# Patient Record
Sex: Male | Born: 1935 | Race: White | Hispanic: No | Marital: Married | Smoking: Former smoker
Health system: Southern US, Community
[De-identification: ages and names within clinical notes are randomized; demographics above are authoritative.]

## PROBLEM LIST (undated history)

## (undated) DIAGNOSIS — K219 Gastro-esophageal reflux disease without esophagitis: Secondary | ICD-10-CM

## (undated) DIAGNOSIS — M1990 Unspecified osteoarthritis, unspecified site: Secondary | ICD-10-CM

## (undated) DIAGNOSIS — N400 Enlarged prostate without lower urinary tract symptoms: Secondary | ICD-10-CM

## (undated) DIAGNOSIS — Z85820 Personal history of malignant melanoma of skin: Secondary | ICD-10-CM

## (undated) DIAGNOSIS — I10 Essential (primary) hypertension: Secondary | ICD-10-CM

## (undated) DIAGNOSIS — Z8546 Personal history of malignant neoplasm of prostate: Secondary | ICD-10-CM

## (undated) DIAGNOSIS — F419 Anxiety disorder, unspecified: Secondary | ICD-10-CM

## (undated) DIAGNOSIS — Z9889 Other specified postprocedural states: Secondary | ICD-10-CM

## (undated) HISTORY — DX: Anxiety disorder, unspecified: F41.9

## (undated) HISTORY — PX: INGUINAL HERNIA REPAIR: SUR1180

## (undated) HISTORY — DX: Gastro-esophageal reflux disease without esophagitis: K21.9

## (undated) HISTORY — DX: Benign prostatic hyperplasia without lower urinary tract symptoms: N40.0

---

## 1986-07-08 HISTORY — PX: OTHER SURGICAL HISTORY: SHX169

## 1998-04-03 ENCOUNTER — Encounter: Admission: RE | Admit: 1998-04-03 | Discharge: 1998-04-03 | Payer: Self-pay | Admitting: Internal Medicine

## 1998-04-18 ENCOUNTER — Encounter: Admission: RE | Admit: 1998-04-18 | Discharge: 1998-04-18 | Payer: Self-pay | Admitting: Hematology and Oncology

## 1999-01-10 ENCOUNTER — Encounter: Admission: RE | Admit: 1999-01-10 | Discharge: 1999-01-10 | Payer: Self-pay | Admitting: Internal Medicine

## 1999-05-09 ENCOUNTER — Encounter: Admission: RE | Admit: 1999-05-09 | Discharge: 1999-05-09 | Payer: Self-pay | Admitting: Internal Medicine

## 1999-06-13 ENCOUNTER — Encounter: Admission: RE | Admit: 1999-06-13 | Discharge: 1999-06-13 | Payer: Self-pay | Admitting: Internal Medicine

## 1999-08-22 ENCOUNTER — Encounter: Admission: RE | Admit: 1999-08-22 | Discharge: 1999-08-22 | Payer: Self-pay | Admitting: Internal Medicine

## 2000-01-14 ENCOUNTER — Encounter: Admission: RE | Admit: 2000-01-14 | Discharge: 2000-01-14 | Payer: Self-pay | Admitting: Internal Medicine

## 2000-01-18 ENCOUNTER — Encounter: Admission: RE | Admit: 2000-01-18 | Discharge: 2000-01-18 | Payer: Self-pay | Admitting: Internal Medicine

## 2000-03-26 ENCOUNTER — Encounter: Admission: RE | Admit: 2000-03-26 | Discharge: 2000-03-26 | Payer: Self-pay | Admitting: Internal Medicine

## 2001-05-12 ENCOUNTER — Encounter: Admission: RE | Admit: 2001-05-12 | Discharge: 2001-05-12 | Payer: Self-pay | Admitting: Internal Medicine

## 2002-12-17 ENCOUNTER — Ambulatory Visit (HOSPITAL_COMMUNITY): Admission: RE | Admit: 2002-12-17 | Discharge: 2002-12-17 | Payer: Self-pay | Admitting: Gastroenterology

## 2003-05-18 ENCOUNTER — Encounter: Admission: RE | Admit: 2003-05-18 | Discharge: 2003-05-18 | Payer: Self-pay | Admitting: Family Medicine

## 2006-05-21 ENCOUNTER — Encounter: Admission: RE | Admit: 2006-05-21 | Discharge: 2006-05-21 | Payer: Self-pay | Admitting: Family Medicine

## 2007-07-09 HISTORY — PX: PROSTATE SURGERY: SHX751

## 2008-01-08 ENCOUNTER — Encounter: Payer: Self-pay | Admitting: Infectious Diseases

## 2008-02-19 ENCOUNTER — Ambulatory Visit: Admission: RE | Admit: 2008-02-19 | Discharge: 2008-05-12 | Payer: Self-pay | Admitting: Radiation Oncology

## 2008-03-24 ENCOUNTER — Ambulatory Visit: Payer: Self-pay | Admitting: Infectious Diseases

## 2008-03-24 DIAGNOSIS — A4902 Methicillin resistant Staphylococcus aureus infection, unspecified site: Secondary | ICD-10-CM | POA: Insufficient documentation

## 2008-03-25 ENCOUNTER — Encounter: Payer: Self-pay | Admitting: Infectious Diseases

## 2008-05-12 ENCOUNTER — Ambulatory Visit: Admission: RE | Admit: 2008-05-12 | Discharge: 2008-06-13 | Payer: Self-pay | Admitting: Radiation Oncology

## 2008-06-13 ENCOUNTER — Encounter: Admission: RE | Admit: 2008-06-13 | Discharge: 2008-06-13 | Payer: Self-pay | Admitting: Urology

## 2008-06-15 ENCOUNTER — Ambulatory Visit (HOSPITAL_BASED_OUTPATIENT_CLINIC_OR_DEPARTMENT_OTHER): Admission: RE | Admit: 2008-06-15 | Discharge: 2008-06-15 | Payer: Self-pay | Admitting: Urology

## 2008-07-14 ENCOUNTER — Ambulatory Visit: Admission: RE | Admit: 2008-07-14 | Discharge: 2008-08-08 | Payer: Self-pay | Admitting: Radiation Oncology

## 2010-07-25 LAB — DIFFERENTIAL
Basophils Absolute: 0 10*3/uL (ref 0.0–0.1)
Basophils Relative: 0 % (ref 0–1)
Eosinophils Absolute: 0.3 10*3/uL (ref 0.0–0.7)
Eosinophils Relative: 3 % (ref 0–5)
Lymphocytes Relative: 34 % (ref 12–46)
Lymphs Abs: 4.1 10*3/uL — ABNORMAL HIGH (ref 0.7–4.0)
Monocytes Absolute: 0.9 10*3/uL (ref 0.1–1.0)
Monocytes Relative: 7 % (ref 3–12)
Neutro Abs: 6.8 10*3/uL (ref 1.7–7.7)
Neutrophils Relative %: 56 % (ref 43–77)

## 2010-07-25 LAB — BASIC METABOLIC PANEL
BUN: 13 mg/dL (ref 6–23)
CO2: 27 mEq/L (ref 19–32)
Calcium: 10.3 mg/dL (ref 8.4–10.5)
Chloride: 102 mEq/L (ref 96–112)
Creatinine, Ser: 0.97 mg/dL (ref 0.4–1.5)
GFR calc Af Amer: >60 mL/min (ref >60)
GFR calc non Af Amer: >60 mL/min (ref >60)
Glucose, Bld: 107 mg/dL — ABNORMAL HIGH (ref 70–99)
Potassium: 4.8 mEq/L (ref 3.5–5.1)
Sodium: 140 mEq/L (ref 135–145)

## 2010-07-25 LAB — CBC
HCT: 47.3 % (ref 39.0–52.0)
Hemoglobin: 16.1 g/dL (ref 13.0–17.0)
MCH: 29.5 pg (ref 26.0–34.0)
MCHC: 34 g/dL (ref 30.0–36.0)
MCV: 86.6 fL (ref 78.0–100.0)
Platelets: 309 10*3/uL (ref 150–400)
RBC: 5.46 MIL/uL (ref 4.22–5.81)
RDW: 13.2 % (ref 11.5–15.5)
WBC: 12.1 10*3/uL — ABNORMAL HIGH (ref 4.0–10.5)

## 2010-07-25 LAB — SURGICAL PCR SCREEN
MRSA, PCR: POSITIVE — AB
Staphylococcus aureus: POSITIVE — AB

## 2010-07-26 ENCOUNTER — Ambulatory Visit (HOSPITAL_COMMUNITY)
Admission: RE | Admit: 2010-07-26 | Discharge: 2010-07-26 | Payer: Self-pay | Source: Home / Self Care | Attending: General Surgery | Admitting: General Surgery

## 2010-07-27 NOTE — Op Note (Signed)
NAMESTEPHANIE, MCGLONE                ACCOUNT NO.:  [id number]  MEDICAL RECORD NO.:  [id number]          PATIENT TYPE:  AMB  LOCATION:  SDS                          FACILITY:  [facility]  PHYSICIAN:  Lennie Muckle, MD      DATE OF BIRTH:  05-15-1931  DATE OF PROCEDURE:  07/26/2010 DATE OF DISCHARGE:  07/26/2010                              OPERATIVE REPORT   PREOPERATIVE DIAGNOSIS:  Bilateral inguinal hernias.  POSTOPERATIVE DIAGNOSIS:  Bilateral inguinal hernias.  PROCEDURE:  Bilateral laparoscopic inguinal hernia repair with mesh.  COMPLICATIONS:  No immediate complications.  ESTIMATED BLOOD LOSS:  Minimal amount of blood loss.  ANESTHESIA:  General endotracheal anesthesia.  INDICATIONS FOR PROCEDURE:  Mr. Kabir is a 77 year old male who was found to have bilateral inguinal hernias.  He had had some pelvic pain and some difficulty with urination and seen by Dr. Vernie Ammons.  He began to have pain over his left groin.  The pain was there with lying down.  On examination, he did have bilateral inguinal hernias.  I talked with him about doing laparoscopic repair that way and have both repairs but had minimal amount of swelling discomfort.  He was seen preoperatively. Informed consent was obtained.  DETAILS OF PROCEDURE:  Mr. Hipolito Bayley was identified in the preoperative holding area.  All questions were answered.  We did discuss leaving the Foley catheter due to his difficulty with urination in the past.  He was seen by Anesthesia given IV antibiotics and taken to the operating room. At the operating room, he was placed in supine position.  After administration of general endotracheal anesthesia, a Foley catheter was placed without difficulty.  His abdomen was then clipped, prepped and draped in usual sterile fashion.  Surgical time-out performed.  I began by placing an incision beneath the umbilicus and due to identify the anterior rectus fascia.  The anterior rectus fascia was incised.   The finger dissected in the preperitoneal space.  I then placed a Spacemaker plus balloon in the preperitoneal space and insufflated this well and visualizing this with the camera.  I insufflated 30 times, hold the balloon cranially and reinsufflated under visualization with camera.  I then removed the Spacemaker plus balloon, insufflated the laparoscopic port and obtaining pneumoinsufflation in the preperitoneal space.  I then placed 2 trocars in the midline under visualization with the camera.  I began by dissecting on the left side of the abdomen, pushing the peritoneum inferiorly.  I dissected down towards the internal ring, cleared off the hernia sac, which was closed in the internal ring. There was a component of a direct hernia, which I was able to clear off the adipose tissue in this vicinity.  He had some large lymph nodes around the internal iliac vessels.  I was able to clear up enough space to place a piece of 3 x 6 UltraPro mesh and tacked this at Cooper's ligament around the direct hernia site just medial to the epigastric vessels as well as laterally while palpating the protract device.  I then began by dissecting on the right side of the abdomen, pushed the peritoneum  inferiorly along the abdominal wall came down towards the internal ring, __________ peritoneum around the internal ring.  I did isolate his spermatic cord vessels.  I also found a direct hernia on this side cleared out the direct hernia site.  I then placed a second piece of UltraPro mesh within the preperitoneal space, fixed this at the Cooper's ligament around the direct hernia sac and laterally while palpating the protract device, I made a small rent within the peritoneum and placed the Endoloop around this on the left.  I then held the inferior edges of the mesh while allowing the pneumo insufflation to release.  I then removed the trocars after releasing the pneumoperitoneum, closed the fascial defect  at the umbilical region using a 0 Vicryl suture.  Once this was closed, I injected 30 mL of Marcaine at all sites for local block.  Skin was closed with 4-0 Monocryl.  Dermabond placed upon the dressing.  The patient tolerated the procedure will be discharged home with Vicodin.  He also be discharged with a leg bag.  He will follow up.  I will have him discontinue him home on Monday morning.  If he has any problems with voiding, I will have him likely follow up with Dr. Vernie Ammons.     Lennie Muckle, MD     ALA/MEDQ  D:  07/26/2010  T:  07/27/2010  Job:  [id number]  cc:   Veverly Fells. Vernie Ammons, M.D. John Dr. Clelia Croft  Electronically Signed by Bertram Savin MD on 07/27/2010 02:51:52 PM

## 2010-11-20 NOTE — Op Note (Signed)
Glen Henry, Glen Henry                ACCOUNT NO.:  [id number]   MEDICAL RECORD NO.:  [id number]          PATIENT TYPE:  AMB   LOCATION:  [facility]                         FACILITY:  [facility]   PHYSICIAN:  Mark C. Vernie Ammons, M.D.  DATE OF BIRTH:  12-21-1930   DATE OF PROCEDURE:  06/15/2008  DATE OF DISCHARGE:                               OPERATIVE REPORT   PREOPERATIVE DIAGNOSES:  Adenocarcinoma of the prostate.   POSTOPERATIVE DIAGNOSES:  Adenocarcinoma of the prostate.   PROCEDURE:  I-125 seed implantation.   SURGEON:  Mark C. Vernie Ammons, M.D.   RADIATION ONCOLOGIST:  Artist Pais. Kathrynn Running, M.D.   ANESTHESIA:  General.   SPECIMENS:  None.   BLOOD LOSS:  Minimal.   DRAINS:  16-French Foley catheter.   COMPLICATIONS:  None.   INDICATIONS:  Patient is a 77 year old male who was found to have an  elevated PSA of 7.8 and a subtle apical nodule on the left-hand side.  He underwent biopsy of the prostate in December 2009 which revealed an  83-cc prostate and adenocarcinoma in multiple cores ranging from 10% to  60% involvement of each of the cores.  He underwent neoadjuvant Lupron  therapy because of the size of his prostate and then had external beam  radiation therapy to the pelvis which is being followed today with I-125  seed implantation.  The patient understands the risks and complications  of surgery and has elected to proceed.   DESCRIPTION OF OPERATION:  After informed consent, patient brought to  the major OR, placed on table, administered general anesthesia, then  moved to the dorsal lithotomy position.  A 16-French Foley catheter was  placed in the bladder with dilute contrast used to fill the balloon and  the transrectal ultrasound probe was placed within the rectum.  It was  connected to the Nucletron and stand and then real-time planning was  then performed.  After the full seed plan was completed by Dr. Kathrynn Running,  I proceeded with seed implantation.   Using the previously  developed plan, a total of 24 needles were used to  implant 79 seeds within the prostate without difficulty.  The  transrectal ultrasound probe was then removed from the rectum and the  Foley catheter was removed from the urethra and flexible cystoscopy was  performed.   Flexible cystoscopy reveals urethra that is normal down to the sphincter  which appears intact.  The prostatic urethra revealed bilobar  hypertrophy with some elongation of the prostatic urethra but no  intraprostatic lesions were identified.  No seeds or foreign bodies were  identified within the prostatic urethra.  Upon entering the bladder, I  note 3+ trabeculation with early cellule formation on the floor the  bladder.  Note, the bladder was then fully and systematically inspected  including retroflexion of the scope and I noted no tumor, stones or  inflammatory lesions.  I also noted no evidence of seeds on the floor of  the bladder or protruding through the base of the prostate into the  bladder.  A moderate-sized median lobe component was identified.  I  therefore  removed the cystoscope and reinserted a new 16-French Foley  catheter which was connected to closed-system drainage and the patient  was awakened and taken to the recovery room in stable, satisfactory  condition.  He tolerated procedure well with no intraoperative  complications.   He will be given a prescription for 30 Vicodin ES, 30 Flomax 0.4 mg and  10 Cipro 500 mg.  He will follow up with myself and Dr. Kathrynn Running in 3  weeks.      Mark C. Vernie Ammons, M.D.  Electronically Signed     MCO/MEDQ  D:  06/15/2008  T:  06/15/2008  Job:  [id number]   cc:   Artist Pais Kathrynn Running, M.D.  Fax: [phone]

## 2011-04-12 LAB — COMPREHENSIVE METABOLIC PANEL
ALT: 54 U/L — ABNORMAL HIGH (ref 0–53)
AST: 35 U/L (ref 0–37)
Albumin: 3.9 g/dL (ref 3.5–5.2)
Alkaline Phosphatase: 64 U/L (ref 39–117)
BUN: 10 mg/dL (ref 6–23)
CO2: 34 mEq/L — ABNORMAL HIGH (ref 19–32)
Calcium: 10.1 mg/dL (ref 8.4–10.5)
Chloride: 102 mEq/L (ref 96–112)
Creatinine, Ser: 0.89 mg/dL (ref 0.4–1.5)
GFR calc Af Amer: >60 mL/min (ref >60)
GFR calc non Af Amer: >60 mL/min (ref >60)
Glucose, Bld: 97 mg/dL (ref 70–99)
Potassium: 4 mEq/L (ref 3.5–5.1)
Sodium: 139 mEq/L (ref 135–145)
Total Bilirubin: 1 mg/dL (ref 0.3–1.2)
Total Protein: 6.7 g/dL (ref 6.0–8.3)

## 2011-04-12 LAB — APTT: aPTT: 36 seconds (ref 24–37)

## 2011-04-12 LAB — CBC
HCT: 43.5 % (ref 39.0–52.0)
Hemoglobin: 14.7 g/dL (ref 13.0–17.0)
MCHC: 33.7 g/dL (ref 30.0–36.0)
MCV: 90.1 fL (ref 78.0–100.0)
Platelets: 305 10*3/uL (ref 150–400)
RBC: 4.83 MIL/uL (ref 4.22–5.81)
RDW: 13.5 % (ref 11.5–15.5)
WBC: 6.7 10*3/uL (ref 4.0–10.5)

## 2011-04-12 LAB — PROTIME-INR
INR: 0.9 (ref 0.00–1.49)
Prothrombin Time: 12.7 seconds (ref 11.6–15.2)

## 2011-10-06 ENCOUNTER — Inpatient Hospital Stay (HOSPITAL_COMMUNITY)
Admission: EM | Admit: 2011-10-06 | Discharge: 2011-10-10 | DRG: 193 | Disposition: A | Discharge status: Home / Self Care | Payer: Medicare Other | Attending: Internal Medicine | Admitting: Internal Medicine

## 2011-10-06 ENCOUNTER — Ambulatory Visit: Payer: Medicare Other

## 2011-10-06 ENCOUNTER — Ambulatory Visit (INDEPENDENT_AMBULATORY_CARE_PROVIDER_SITE_OTHER): Payer: Medicare Other | Admitting: Family Medicine

## 2011-10-06 ENCOUNTER — Emergency Department (HOSPITAL_COMMUNITY): Payer: Medicare Other

## 2011-10-06 ENCOUNTER — Encounter (HOSPITAL_COMMUNITY): Payer: Self-pay | Admitting: Family Medicine

## 2011-10-06 VITALS — BP 162/78 | HR 101 | Temp 100.0°F | Resp 18 | Ht 66.0 in | Wt 172.0 lb

## 2011-10-06 DIAGNOSIS — A0472 Enterocolitis due to Clostridium difficile, not specified as recurrent: Secondary | ICD-10-CM | POA: Diagnosis present

## 2011-10-06 DIAGNOSIS — R05 Cough: Secondary | ICD-10-CM

## 2011-10-06 DIAGNOSIS — A4902 Methicillin resistant Staphylococcus aureus infection, unspecified site: Secondary | ICD-10-CM

## 2011-10-06 DIAGNOSIS — G9340 Encephalopathy, unspecified: Secondary | ICD-10-CM | POA: Diagnosis present

## 2011-10-06 DIAGNOSIS — I10 Essential (primary) hypertension: Secondary | ICD-10-CM | POA: Diagnosis present

## 2011-10-06 DIAGNOSIS — Z87891 Personal history of nicotine dependence: Secondary | ICD-10-CM

## 2011-10-06 DIAGNOSIS — D72829 Elevated white blood cell count, unspecified: Secondary | ICD-10-CM | POA: Diagnosis present

## 2011-10-06 DIAGNOSIS — J189 Pneumonia, unspecified organism: Principal | ICD-10-CM | POA: Diagnosis present

## 2011-10-06 DIAGNOSIS — E860 Dehydration: Secondary | ICD-10-CM

## 2011-10-06 DIAGNOSIS — R509 Fever, unspecified: Secondary | ICD-10-CM

## 2011-10-06 DIAGNOSIS — R4182 Altered mental status, unspecified: Secondary | ICD-10-CM

## 2011-10-06 DIAGNOSIS — Z9889 Other specified postprocedural states: Secondary | ICD-10-CM

## 2011-10-06 DIAGNOSIS — E871 Hypo-osmolality and hyponatremia: Secondary | ICD-10-CM | POA: Diagnosis present

## 2011-10-06 DIAGNOSIS — R197 Diarrhea, unspecified: Secondary | ICD-10-CM

## 2011-10-06 DIAGNOSIS — Z8546 Personal history of malignant neoplasm of prostate: Secondary | ICD-10-CM

## 2011-10-06 DIAGNOSIS — R9389 Abnormal findings on diagnostic imaging of other specified body structures: Secondary | ICD-10-CM | POA: Diagnosis present

## 2011-10-06 DIAGNOSIS — Z85820 Personal history of malignant melanoma of skin: Secondary | ICD-10-CM

## 2011-10-06 DIAGNOSIS — R911 Solitary pulmonary nodule: Secondary | ICD-10-CM | POA: Diagnosis present

## 2011-10-06 HISTORY — DX: Personal history of malignant neoplasm of prostate: Z85.46

## 2011-10-06 HISTORY — DX: Other specified postprocedural states: Z98.890

## 2011-10-06 HISTORY — DX: Personal history of malignant melanoma of skin: Z85.820

## 2011-10-06 HISTORY — DX: Essential (primary) hypertension: I10

## 2011-10-06 LAB — DIFFERENTIAL
Basophils Absolute: 0 10*3/uL (ref 0.0–0.1)
Basophils Relative: 0 % (ref 0–1)
Eosinophils Absolute: 0 10*3/uL (ref 0.0–0.7)
Eosinophils Relative: 0 % (ref 0–5)
Lymphocytes Relative: 14 % (ref 12–46)
Lymphs Abs: 3.6 10*3/uL (ref 0.7–4.0)
Monocytes Absolute: 2.1 10*3/uL — ABNORMAL HIGH (ref 0.1–1.0)
Monocytes Relative: 8 % (ref 3–12)
Neutro Abs: 20 10*3/uL — ABNORMAL HIGH (ref 1.7–7.7)
Neutrophils Relative %: 78 % — ABNORMAL HIGH (ref 43–77)

## 2011-10-06 LAB — BASIC METABOLIC PANEL
BUN: 12 mg/dL (ref 6–23)
CO2: 24 mEq/L (ref 19–32)
Calcium: 9.9 mg/dL (ref 8.4–10.5)
Chloride: 98 mEq/L (ref 96–112)
Creatinine, Ser: 0.84 mg/dL (ref 0.50–1.35)
GFR calc Af Amer: >90 mL/min (ref >90)
GFR calc non Af Amer: 82 mL/min — ABNORMAL LOW (ref >90)
Glucose, Bld: 126 mg/dL — ABNORMAL HIGH (ref 70–99)
Potassium: 4.1 mEq/L (ref 3.5–5.1)
Sodium: 135 mEq/L (ref 135–145)

## 2011-10-06 LAB — CBC
HCT: 42.1 % (ref 39.0–52.0)
Hemoglobin: 14.7 g/dL (ref 13.0–17.0)
MCH: 30.5 pg (ref 26.0–34.0)
MCHC: 34.9 g/dL (ref 30.0–36.0)
MCV: 87.3 fL (ref 78.0–100.0)
Platelets: 511 10*3/uL — ABNORMAL HIGH (ref 150–400)
RBC: 4.82 MIL/uL (ref 4.22–5.81)
RDW: 13.7 % (ref 11.5–15.5)
WBC: 25.7 10*3/uL — ABNORMAL HIGH (ref 4.0–10.5)

## 2011-10-06 LAB — POCT CBC
Granulocyte percent: 81.9 %G — AB (ref 37–80)
HCT, POC: 42 % — AB (ref 43.5–53.7)
Hemoglobin: 13.7 g/dL — AB (ref 14.1–18.1)
Lymph, poc: 2.7 (ref 0.6–3.4)
MCH, POC: 28.8 pg (ref 27–31.2)
MCHC: 32.6 g/dL (ref 31.8–35.4)
MCV: 88.3 fL (ref 80–97)
MID (cbc): 1.2 — AB (ref 0–0.9)
MPV: 7.9 fL (ref 0–99.8)
POC Granulocyte: 17.7 — AB (ref 2–6.9)
POC LYMPH PERCENT: 12.4 %L (ref 10–50)
POC MID %: 5.7 %M (ref 0–12)
Platelet Count, POC: 586 10*3/uL — AB (ref 142–424)
RBC: 4.76 M/uL (ref 4.69–6.13)
RDW, POC: 14.4 %
WBC: 21.6 10*3/uL — AB (ref 4.6–10.2)

## 2011-10-06 LAB — URINALYSIS, ROUTINE W REFLEX MICROSCOPIC
Bilirubin Urine: NEGATIVE
Glucose, UA: NEGATIVE mg/dL
Ketones, ur: NEGATIVE mg/dL
Nitrite: NEGATIVE
Protein, ur: 30 mg/dL — AB
Specific Gravity, Urine: 1.021 (ref 1.005–1.030)
Urobilinogen, UA: 1 mg/dL (ref 0.0–1.0)
pH: 6.5 (ref 5.0–8.0)

## 2011-10-06 LAB — URINE MICROSCOPIC-ADD ON

## 2011-10-06 MED ORDER — ALBUTEROL SULFATE (5 MG/ML) 0.5% IN NEBU
5.0000 mg | INHALATION_SOLUTION | Freq: Once | RESPIRATORY_TRACT | Status: AC
  Administered 2011-10-06: 5 mg via RESPIRATORY_TRACT
  Filled 2011-10-06 (×2): qty 1

## 2011-10-06 MED ORDER — SODIUM CHLORIDE 0.9 % IV SOLN
INTRAVENOUS | Status: DC
  Administered 2011-10-06: 21:00:00 via INTRAVENOUS

## 2011-10-06 MED ORDER — DEXTROSE 5 % IV SOLN
1.0000 g | Freq: Once | INTRAVENOUS | Status: AC
  Administered 2011-10-07: 1 g via INTRAVENOUS
  Filled 2011-10-06: qty 10

## 2011-10-06 MED ORDER — IOHEXOL 300 MG/ML  SOLN
100.0000 mL | Freq: Once | INTRAMUSCULAR | Status: AC | PRN
  Administered 2011-10-06: 100 mL via INTRAVENOUS

## 2011-10-06 MED ORDER — PREDNISONE 20 MG PO TABS
60.0000 mg | ORAL_TABLET | Freq: Once | ORAL | Status: AC
  Administered 2011-10-06: 60 mg via ORAL
  Filled 2011-10-06: qty 3

## 2011-10-06 MED ORDER — DEXTROSE 5 % IV SOLN
500.0000 mg | Freq: Once | INTRAVENOUS | Status: AC
  Administered 2011-10-07: 500 mg via INTRAVENOUS
  Filled 2011-10-06 (×2): qty 500

## 2011-10-06 NOTE — ED Notes (Signed)
ONG:EX52<WU> Expected date:10/06/11<BR> Expected time: 5:29 PM<BR> Means of arrival:Ambulance<BR> Comments:<BR> M31. 78 yo m. From [employer] Poss Pneumonia. 10 mins

## 2011-10-06 NOTE — ED Notes (Signed)
Pt in via ems from urgent care with possible pneumonia and uti per ems pt has had productive cough x 2 weeks pt states pain with cough

## 2011-10-06 NOTE — ED Notes (Signed)
Pt currently rcving neb tx

## 2011-10-06 NOTE — Progress Notes (Signed)
  Subjective:    Patient ID: Glen Henry, male    DOB: 01-Mar-1931, 79 y.o.   MRN: [id number]  HPI Patient presents with complainig of chest congestion that has been present for several weeks. Over the last 1-2 days patient developed low grade fevers Some wheezing, thought per patient this was  improving   Son has been sick at home ? influenza History of "sinus", worse in the spring   Review of Systems  Respiratory: Positive for cough (purulent in color). Negative for shortness of breath and wheezing.        Objective:   Physical Exam  HENT:  Right Ear: Tympanic membrane normal.  Left Ear: Tympanic membrane normal.  Nose: Rhinorrhea present.  Mouth/Throat: Mucous membranes are dry.  Neck: Neck supple.  Cardiovascular: Regular rhythm and normal heart sounds.  Tachycardia present.  Exam reveals no S3 and no S4.   Pulmonary/Chest: Effort normal. He has decreased breath sounds in the left lower field. He has rhonchi.  Neurological: He is alert.       confused    Results for orders placed in visit on 10/06/11  POCT CBC      Component Value Range   WBC 21.6 (*) 4.6 - 10.2 (K/uL)   Lymph, poc 2.7  0.6 - 3.4    POC LYMPH PERCENT 12.4  10 - 50 (%L)   MID (cbc) 1.2 (*) 0 - 0.9    POC MID % 5.7  0 - 12 (%M)   POC Granulocyte 17.7 (*) 2 - 6.9    Granulocyte percent 81.9 (*) 37 - 80 (%G)   RBC 4.76  4.69 - 6.13 (M/uL)   Hemoglobin 13.7 (*) 14.1 - 18.1 (g/dL)   HCT, POC 40.9 (*) 81.1 - 53.7 (%)   MCV 88.3  80 - 97 (fL)   MCH, POC 28.8  27 - 31.2 (pg)   MCHC 32.6  31.8 - 35.4 (g/dL)   RDW, POC 91.4     Platelet Count, POC 586 (*) 142 - 424 (K/uL)   MPV 7.9  0 - 99.8 (fL)   [facility] reading (PRIMARY) by  Dr.Nelia Rogoff LLL infiltrate        Assessment & Plan:   1. Pneumonia  DG Chest 2 View, POCT CBC  2. hypoxemia  DG Chest 2 View, POCT CBC  3. Dehydration    4. Mental status alteration     IVF NS 2 liter O2 Admit for IV antibiotics, rehydration and O2 therapy  Son,  Darryl,  in agreement with transport [facility] aware of transport and transfer of care

## 2011-10-06 NOTE — ED Provider Notes (Signed)
History     CSN: [id number]  Arrival date & time 10/06/11  1733   First MD Initiated Contact with Patient 10/06/11 1938      Chief Complaint  Patient presents with  . Shortness of Breath    (Consider location/radiation/quality/duration/timing/severity/associated sxs/prior treatment) HPI Comments: Sent for concern of pneumonia  Patient is a 78 y.o. male presenting with cough. The history is provided by the patient. No language interpreter was used.  Cough This is a new problem. The current episode started more than 1 week ago (2 weeks ago). The problem occurs constantly. The problem has been gradually worsening. The cough is productive of sputum. The maximum temperature recorded prior to his arrival was 100 to 100.9 F. Associated symptoms include chills, shortness of breath and wheezing. Pertinent negatives include no chest pain, no sweats, no weight loss, no ear congestion, no ear pain, no headaches, no rhinorrhea, no sore throat and no myalgias. He has tried nothing for the symptoms. The treatment provided no relief. Smoker: former.    No past medical history on file.  No past surgical history on file.  No family history on file.  History  Substance Use Topics  . Smoking status: Former Games developer  . Smokeless tobacco: Not on file  . Alcohol Use: Not on file      Review of Systems  Constitutional: Positive for chills and activity change. Negative for weight loss and appetite change.  HENT: Positive for congestion. Negative for ear pain, sore throat, rhinorrhea, neck pain and neck stiffness.   Respiratory: Positive for cough, shortness of breath and wheezing.   Cardiovascular: Negative for chest pain and palpitations.  Gastrointestinal: Negative for nausea, vomiting and abdominal pain.  Genitourinary: Negative for dysuria, urgency, frequency and flank pain.  Musculoskeletal: Negative for myalgias, back pain and arthralgias.  Neurological: Negative for dizziness, weakness,  light-headedness, numbness and headaches.  All other systems reviewed and are negative.    Allergies  Review of patient's allergies indicates no known allergies.  Home Medications   Current Outpatient Rx  Name Route Sig Dispense Refill  . ASPIRIN 325 MG PO TABS Oral Take 325 mg by mouth 2 (two) times daily.    Marland Kitchen CALCIUM CARBONATE-VITAMIN D 500-200 MG-UNIT PO TABS Oral Take 1 tablet by mouth 2 (two) times daily.      BP 144/71  Pulse 104  Temp(Src) 100 F (37.8 C) (Oral)  Resp 19  SpO2 94%  Physical Exam  Nursing note and vitals reviewed. Constitutional: He is oriented to person, place, and time. He appears well-developed and well-nourished. No distress.  HENT:  Head: Normocephalic and atraumatic.  Mouth/Throat: Oropharynx is clear and moist.  Eyes: Conjunctivae and EOM are normal. Pupils are equal, round, and reactive to light.  Neck: Normal range of motion. Neck supple.  Cardiovascular: Regular rhythm, normal heart sounds and intact distal pulses.  Exam reveals no gallop and no friction rub.   No murmur heard.      Tachycardic rate  Pulmonary/Chest: Effort normal. No respiratory distress. He has wheezes. He exhibits no tenderness.  Abdominal: Soft. Bowel sounds are normal. There is no tenderness.  Musculoskeletal: Normal range of motion. He exhibits no edema and no tenderness.  Neurological: He is alert and oriented to person, place, and time. No cranial nerve deficit.  Skin: Skin is warm and dry. No rash noted.    ED Course  Procedures (including critical care time)  Labs Reviewed  URINALYSIS, ROUTINE W REFLEX MICROSCOPIC - Abnormal; Notable for  the following:    APPearance CLOUDY (*)    Hgb urine dipstick TRACE (*)    Protein, ur 30 (*)    Leukocytes, UA TRACE (*)    All other components within normal limits  CBC - Abnormal; Notable for the following:    WBC 25.7 (*)    Platelets 511 (*)    All other components within normal limits  DIFFERENTIAL - Abnormal;  Notable for the following:    Neutrophils Relative 78 (*)    Neutro Abs 20.0 (*)    Monocytes Absolute 2.1 (*)    All other components within normal limits  BASIC METABOLIC PANEL - Abnormal; Notable for the following:    Glucose, Bld 126 (*)    GFR calc non Af Amer 82 (*)    All other components within normal limits  URINE MICROSCOPIC-ADD ON - Abnormal; Notable for the following:    Bacteria, UA FEW (*)    All other components within normal limits   Dg Chest 2 View  10/06/2011  *RADIOLOGY REPORT*  Clinical Data: Cough.  CHEST - 2 VIEW  Comparison: 07/24/2010.  Findings: The cardiac silhouette, mediastinal and hilar contours are within normal limits and stable.  Bilateral pulmonary nodules are suspected.  Recommend further evaluation with chest CT.  No focal infiltrates, edema or effusions.  The bony thorax is intact.  IMPRESSION: Suspect bilateral pulmonary nodules worrisome for metastatic disease.  Recommend chest CT with contrast for further evaluation.  Original Report Authenticated By: P. Loralie Champagne, M.D.   Ct Chest W Contrast  10/06/2011  *RADIOLOGY REPORT*  Clinical Data: Chest congestion, fever, and wheezing.  Suspicion of pulmonary nodules on plain radiograph. Former smoker.  CT CHEST WITH CONTRAST  Technique:  Multidetector CT imaging of the chest was performed following the standard protocol during bolus administration of intravenous contrast.  Contrast: OMNIPAQUE IOHEXOL 300 MG/ML IJ SOLN  Comparison: Chest 10/06/2011  Findings: Multiple old somewhat poorly defined irregularly marginated ground-glass nodules scattered throughout the right lung.  Largest of these measures about 1.3 cm diameter.  Tiny nodules on the left, greatest size measuring 5 mm.  On the right, they are somewhat more confluent areas in the right middle lung which have the tree in bud pattern.  Changes could represent pulmonary metastasis or atypical nodular infectious process such as TB or fungal infection.  Mildly enlarged right pretracheal, right hilar, and subcarinal lymph nodes measuring up to 13 mm diameter. These could represent metastatic or reactive nodes.  Surgical clips in the left axilla.  No additional lymphadenopathy in the chest. Normal caliber thoracic aorta without aneurysm.  Normal heart size. Small esophageal hiatal hernia.  The esophagus is mostly decompressed without wall thickening.  No pleural effusion.  Mild dependent atelectasis in the lung bases.  Degenerative changes in the thoracic spine.  No destructive bone lesions appreciated.  IMPRESSION: Multiple poorly defined nodules scattered throughout the right lung. Mild right-sided mediastinal lymphadenopathy.  Differential diagnosis includes metastasis, lymphoma, multi focal primary carcinoma, or atypical infectious process.  Original Report Authenticated By: Marlon Pel, M.D.     1. Leukocytosis   2. Atypical pneumonia       MDM  Leukocytosis with concern for atypical pneumonia. As atypical pneumonia versus possible mass. He is placed on Rocephin and Zithromax. Received IV fluids. He will be admitted for further evaluation and treatment.        Dayton Bailiff, MD 10/06/11 2259

## 2011-10-07 ENCOUNTER — Encounter (HOSPITAL_COMMUNITY): Payer: Self-pay

## 2011-10-07 DIAGNOSIS — J9600 Acute respiratory failure, unspecified whether with hypoxia or hypercapnia: Secondary | ICD-10-CM

## 2011-10-07 DIAGNOSIS — R911 Solitary pulmonary nodule: Secondary | ICD-10-CM

## 2011-10-07 LAB — EXPECTORATED SPUTUM ASSESSMENT W GRAM STAIN, RFLX TO RESP C

## 2011-10-07 LAB — BASIC METABOLIC PANEL
CO2: 24 mEq/L (ref 19–32)
Chloride: 95 mEq/L — ABNORMAL LOW (ref 96–112)
Sodium: 130 mEq/L — ABNORMAL LOW (ref 135–145)

## 2011-10-07 LAB — CEA: CEA: 0.7 ng/mL (ref 0.0–5.0)

## 2011-10-07 LAB — AFP TUMOR MARKER: AFP-Tumor Marker: <1.3 ng/mL (ref 0.0–8.0)

## 2011-10-07 LAB — MRSA PCR SCREENING: MRSA by PCR: POSITIVE — AB

## 2011-10-07 LAB — INFLUENZA PANEL BY PCR (TYPE A & B): H1N1 flu by pcr: NOT DETECTED

## 2011-10-07 LAB — CBC
Platelets: 532 10*3/uL — ABNORMAL HIGH (ref 150–400)
RBC: 4.69 MIL/uL (ref 4.22–5.81)
WBC: 26.1 10*3/uL — ABNORMAL HIGH (ref 4.0–10.5)

## 2011-10-07 LAB — SEDIMENTATION RATE: Sed Rate: 45 mm/hr — ABNORMAL HIGH (ref 0–16)

## 2011-10-07 MED ORDER — IPRATROPIUM BROMIDE 0.02 % IN SOLN: 0.5000 mg | RESPIRATORY_TRACT | Status: DC | PRN

## 2011-10-07 MED ORDER — SODIUM CHLORIDE 0.9 % IJ SOLN: 3.0000 mL | INTRAMUSCULAR | Status: DC | PRN

## 2011-10-07 MED ORDER — BISACODYL 5 MG PO TBEC
10.0000 mg | DELAYED_RELEASE_TABLET | Freq: Every day | ORAL | Status: DC | PRN
  Administered 2011-10-07: 10 mg via ORAL
  Filled 2011-10-07: qty 2

## 2011-10-07 MED ORDER — ACETAMINOPHEN 325 MG PO TABS: 650.0000 mg | ORAL_TABLET | Freq: Four times a day (QID) | ORAL | Status: DC | PRN

## 2011-10-07 MED ORDER — MUPIROCIN 2 % EX OINT
1.0000 "application " | TOPICAL_OINTMENT | Freq: Two times a day (BID) | CUTANEOUS | Status: DC
  Administered 2011-10-07 – 2011-10-10 (×7): 1 via NASAL
  Filled 2011-10-07: qty 22

## 2011-10-07 MED ORDER — SODIUM CHLORIDE 0.9 % IV SOLN
INTRAVENOUS | Status: AC
  Administered 2011-10-07: 03:00:00 via INTRAVENOUS

## 2011-10-07 MED ORDER — DM-GUAIFENESIN ER 30-600 MG PO TB12
1.0000 | ORAL_TABLET | Freq: Two times a day (BID) | ORAL | Status: DC
  Administered 2011-10-07 – 2011-10-10 (×7): 1 via ORAL
  Filled 2011-10-07 (×8): qty 1

## 2011-10-07 MED ORDER — ALBUTEROL SULFATE (5 MG/ML) 0.5% IN NEBU: 2.5000 mg | INHALATION_SOLUTION | RESPIRATORY_TRACT | Status: DC | PRN

## 2011-10-07 MED ORDER — ONDANSETRON HCL 4 MG/2ML IJ SOLN: 4.0000 mg | Freq: Three times a day (TID) | INTRAMUSCULAR | Status: AC | PRN

## 2011-10-07 MED ORDER — BIOTENE DRY MOUTH MT LIQD
15.0000 mL | Freq: Two times a day (BID) | OROMUCOSAL | Status: DC
  Administered 2011-10-08 – 2011-10-10 (×5): 15 mL via OROMUCOSAL

## 2011-10-07 MED ORDER — CEFEPIME HCL 1 G IJ SOLR
1.0000 g | Freq: Two times a day (BID) | INTRAMUSCULAR | Status: DC
  Administered 2011-10-07 (×2): 1 g via INTRAVENOUS
  Filled 2011-10-07 (×3): qty 1

## 2011-10-07 MED ORDER — ONDANSETRON HCL 4 MG PO TABS: 4.0000 mg | ORAL_TABLET | Freq: Four times a day (QID) | ORAL | Status: DC | PRN

## 2011-10-07 MED ORDER — SODIUM CHLORIDE 0.9 % IV SOLN
INTRAVENOUS | Status: DC
  Administered 2011-10-07 – 2011-10-09 (×4): via INTRAVENOUS

## 2011-10-07 MED ORDER — VANCOMYCIN HCL IN DEXTROSE 1-5 GM/200ML-% IV SOLN
1000.0000 mg | Freq: Two times a day (BID) | INTRAVENOUS | Status: DC
  Administered 2011-10-07 – 2011-10-08 (×4): 1000 mg via INTRAVENOUS
  Filled 2011-10-07 (×5): qty 200

## 2011-10-07 MED ORDER — CHLORHEXIDINE GLUCONATE CLOTH 2 % EX PADS
6.0000 | MEDICATED_PAD | Freq: Every day | CUTANEOUS | Status: DC
  Administered 2011-10-07 – 2011-10-10 (×4): 6 via TOPICAL

## 2011-10-07 MED ORDER — ONDANSETRON HCL 4 MG/2ML IJ SOLN
4.0000 mg | Freq: Four times a day (QID) | INTRAMUSCULAR | Status: DC | PRN
  Administered 2011-10-07: 4 mg via INTRAVENOUS
  Filled 2011-10-07: qty 2

## 2011-10-07 MED ORDER — ENOXAPARIN SODIUM 40 MG/0.4ML ~~LOC~~ SOLN
40.0000 mg | SUBCUTANEOUS | Status: DC
Start: 2011-10-07 — End: 2011-10-10
  Administered 2011-10-07 – 2011-10-10 (×4): 40 mg via SUBCUTANEOUS
  Filled 2011-10-07 (×4): qty 0.4

## 2011-10-07 MED ORDER — HYDROCODONE-ACETAMINOPHEN 5-325 MG PO TABS
1.0000 | ORAL_TABLET | ORAL | Status: DC | PRN
  Administered 2011-10-09: 1 via ORAL
  Filled 2011-10-07: qty 1

## 2011-10-07 MED ORDER — ZOLPIDEM TARTRATE 5 MG PO TABS
5.0000 mg | ORAL_TABLET | Freq: Every evening | ORAL | Status: DC | PRN
  Administered 2011-10-08 – 2011-10-09 (×2): 5 mg via ORAL
  Filled 2011-10-07 (×2): qty 1

## 2011-10-07 MED ORDER — ALUM & MAG HYDROXIDE-SIMETH 200-200-20 MG/5ML PO SUSP
30.0000 mL | Freq: Four times a day (QID) | ORAL | Status: DC | PRN
  Administered 2011-10-07 – 2011-10-08 (×2): 30 mL via ORAL
  Filled 2011-10-07 (×2): qty 30

## 2011-10-07 MED ORDER — ACETAMINOPHEN 650 MG RE SUPP: 650.0000 mg | Freq: Four times a day (QID) | RECTAL | Status: DC | PRN

## 2011-10-07 NOTE — Progress Notes (Signed)
Inpatient Diabetes Program Recommendations  AACE/ADA: New Consensus Statement on Inpatient Glycemic Control (2009)  Target Ranges:  Prepandial:   less than 140 mg/dL      Peak postprandial:   less than 180 mg/dL (1-2 hours)      Critically ill patients:  140 - 180 mg/dL   Reason for Visit:  Results for JABORI, HENEGAR (MRN [id number]) as of 10/07/2011 11:07  Ref. Range 10/07/2011 04:05  Glucose Latest Range: 70-99 mg/dL 409 (H)    Inpatient Diabetes Program Recommendations HgbA1C: Request MD consider ordering Hgb A1C.  Lab glucose this AM was 226 after receiving one-time dose of prednisone 60 mg.    Note: No history of diabetes noted.  Note Bmet ordered for AM.  Thank you. [phone])

## 2011-10-07 NOTE — Progress Notes (Signed)
ANTIBIOTIC CONSULT NOTE - INITIAL  Pharmacy Consult for Vancomycin Indication: rule out pneumonia  Allergies  Allergen Reactions  . Codeine Nausea And Vomiting    Patient Measurements: Height: 5\' 9"  (175.3 cm) Weight: 168 lb 14.4 oz (76.613 kg) IBW/kg (Calculated) : 70.7    Vital Signs: Temp: 97.5 F (36.4 C) (04/01 0145) Temp src: Oral (04/01 0145) BP: 153/85 mmHg (04/01 0145) Pulse Rate: 93  (04/01 0145) Intake/Output from previous day: 03/31 0701 - 04/01 0700 In: -  Out: 300 [Urine:300] Intake/Output from this shift: Total I/O In: -  Out: 300 [Urine:300]  Labs:  [facility] 10/06/11 2055 10/06/11 1622  WBC 25.7* 21.6*  HGB 14.7 13.7*  PLT 511* --  LABCREA -- --  CREATININE 0.84 --   Estimated Creatinine Clearance: 72.5 ml/min (by C-G formula based on Cr of 0.84). No results found for this basename: VANCOTROUGH:2,VANCOPEAK:2,VANCORANDOM:2,GENTTROUGH:2,GENTPEAK:2,GENTRANDOM:2,TOBRATROUGH:2,TOBRAPEAK:2,TOBRARND:2,AMIKACINPEAK:2,AMIKACINTROU:2,AMIKACIN:2, in the last 72 hours   Microbiology: No results found for this or any previous visit (from the past 720 hour(s)).  Medical History: Past Medical History  Diagnosis Date  . H/O prostate cancer     Seed implantation  . Hypertension   . H/O melanoma excision     Excised in 1988    Medications:  Scheduled:    . sodium chloride   Intravenous STAT  . albuterol  5 mg Nebulization Once  . azithromycin  500 mg Intravenous Once  . ceFEPime (MAXIPIME) IV  1 g Intravenous Q12H  . cefTRIAXone (ROCEPHIN)  IV  1 g Intravenous Once  . enoxaparin  40 mg Subcutaneous Q24H  . predniSONE  60 mg Oral Once   Infusions:    . DISCONTD: sodium chloride 125 mL/hr at 10/06/11 2051   Assessment:  78 year old man with chief complaint of shortness of breath.  Reportedly ill for past two weeks.  Rocephin 1gm IV x 1 given in ED ~ 1am today.  Zithromax 500mg  IV x 1 also ordered in ED  CrCl ~ 72 ml/min  Pt with history of  prostate CA and melanoma; chest xray shows findings concerning for malignancy  IV Vancomycin and Cefepime ordered for treatment of suspected pneumonia  Goal of Therapy:  Vancomycin trough level 15-20 mcg/ml  Plan:   Vancomycin 1000mg  IV q12h  Follow cultures & sensitivities  Check vancomycin trough level as appropriate.  Maryellen Pile, PharmD 10/07/2011,2:04 AM

## 2011-10-07 NOTE — Consult Note (Addendum)
Name: Glen Henry MRN: [id number] DOB: 07-28-1930    LOS: 1  [place] [facility]  NOTE  Brief patient profile:  47 yowm with h/o prostate ca and melanoma admitted 3/31 with sev weeks cough with multiple pulmonary nodules on cxr/cT and ccm asked to eval 4/1     Micro/sepsis markers: MRSA screen 4/1 > neg Sputum 4/1 >>>  Antibiotics: Roc/Zmax ER only Maxepime (?pna)4/1>> Vanc (?pna)3/31>>  Tests / Events: CT chest 3/31 > Multiple poorly defined nodules scattered throughout the right  lung. Mild right-sided mediastinal lymphadenopathy  Subective Elderly wm remote smoker with 2 weeks of abupt onset cough, chest congestion with minimal purulent secretions asssoc with fever chills and abn mental status per fm. No sore throat, myalgias, ha, rash n or v and d or unusual traval hx  Sleeping ok without nocturnal  or early am exacerbation  of respiratory  c/o's   Also denies any obvious fluctuation of symptoms with weather or environmental changes or other aggravating or alleviating factors except as outlined above   ROS  At present neg for  any significant sore throat, dysphagia, dental problems, itching, sneezing,   ear ache,  chills, sweats, unintended wt loss, pleuritic or exertional cp, hemoptysis, palpitations, orthopnea pnd or leg swelling.  Also denies presyncope, palpitations, heartburn, abdominal pain, anorexia, nausea, vomiting, diarrhea  or change in bowel or urinary habits, change in stools or urine, dysuria,hematuria,  rash, arthralgias, visual complaints, headache, numbness weakness or ataxia or problems with walking or coordination.                     Vital Signs: BP 137/70  Pulse 77  Temp(Src) 98.3 F (36.8 C) (Oral)  Resp 18  Ht 5\' 9"  (1.753 m)  Wt 168 lb 14.4 oz (76.613 kg)  BMI 24.94 kg/m2  SpO2 92%  On 2lpm  Intake/Output Summary (Last 24 hours) at 10/07/11 1556 Last data filed at 10/07/11 1358  Gross per 24 hour  Intake  717.5 ml  Output    900 ml  Net  -182.5 ml     Physical Examination: General: elderly frail wm nad Neuro:  Nonfocal, responses slowed HEENT:  PERRL, pink conjunctivae, moist membranes Neck:  Supple, no JVD   Cardiovascular:  RRR, no M/R/G Lungs:  Bilateral diminished air entry, no W/R/R Abdomen:  Soft, nontender, nondistended, bowel sounds present Musculoskeletal:  Moves all extremities, no pedal edema Skin:  No rash  Ventilator settings:    Labs    Lab 10/07/11 0405 10/06/11 2055  NA 130* 135  K 3.7 4.1  CL 95* 98  CO2 24 24  BUN 14 12  CREATININE 0.87 0.84  GLUCOSE 226* 126*    Lab 10/07/11 0405 10/06/11 2055 10/06/11 1622  HGB 13.7 14.7 13.7*  HCT 40.7 42.1 42.0*  WBC 26.1* 25.7* 21.6*  PLT 532* 511* --             ASSESSMENT AND PLAN    Multiple pulmonary nodules ? etilogy Met ca, septic emboli, collagen vasc dz in ddx  - No need for further inpt w/u - outpt pet then bx the most accessible hot spot p you treat him for his uri as I don't think these nodules have anything to do with his resp symptoms - Check baseline pct/ esr/ Anca levels      NEUROLOGIC A:  ? Mild tme vs underlying dementia? If not back to baseline consider mri brain to look for head mets as PET scan  not good for cns

## 2011-10-07 NOTE — ED Notes (Signed)
Phleb at bedside drawing 2nd set of [facility]

## 2011-10-07 NOTE — Progress Notes (Signed)
   CARE MANAGEMENT NOTE 10/07/2011  Patient:  Glen Henry, Glen Henry   Account Number:  [id number]  Date Initiated:  10/07/2011  Documentation initiated by:  [facility]  Subjective/Objective Assessment:   ADMITTED W/SOB,FEVER.HX: PROSTATE CA,MELANOMA     Action/Plan:   FROM HOME W/SPOUSE   Anticipated DC Date:  10/11/2011   Anticipated DC Plan:  HOME/SELF CARE         Choice offered to / List presented to:             Status of service:  In process, will continue to follow Medicare Important Message given?   (If response is "NO", the following Medicare IM given date fields will be blank) Date Medicare IM given:   Date Additional Medicare IM given:    Discharge Disposition:    Per UR Regulation:  Reviewed for med. necessity/level of care/duration of stay  If discussed at Long Length of Stay Meetings, dates discussed:    Comments:  10/07/11 [facility] RN,BSN NCM [phone]

## 2011-10-07 NOTE — H&P (Signed)
PCP:   Clydie Braun, MD, MD   Chief Complaint:  Shortness of breath  HPI: This is a 78 year old gentleman, who is apparently been ill for the past two weeks. He's had subjective fevers and chills. He's had a nonproductive cough and a cold. He's had some wheezing, lightheadedness and dizziness. His appetite as being poor and he has been weak He reports sinus issues. The patient has been ill for 2 weeks but has not wanted to come to the doctor. His wife was able to convince him to go today. chest x-ray done outpatient was concerning, therefore, patient was sent to to the ER. Patient denies any nausea, vomiting, diarrhea, burning urination. Per wife he's been confused today. In the ER, the patient does answer questions, but does appear confused. He is tangential, and also answers and provides information on questions not asked. He also appears to be unable to grasp the possible gravity of our findings. History provided by both patient and his wife is at the bedside.  Review of Systems: Positives bolded  anorexia, fever, weight loss,, vision loss, decreased hearing, hoarseness, chest pain, syncope, dyspnea on exertion, peripheral edema, balance deficits, hemoptysis, abdominal pain, melena, hematochezia, severe indigestion/heartburn, hematuria, incontinence, genital sores, muscle weakness, suspicious skin lesions, transient blindness, difficulty walking, depression, unusual weight change, abnormal bleeding, enlarged lymph nodes, angioedema, and breast masses.  Past Medical History: Past Medical History  Diagnosis Date  . H/O prostate cancer     Seed implantation  . Hypertension   . H/O melanoma excision     Excised in 1988   Past Surgical History  Procedure Date  . Inguinal hernia repair     Bilaterally    Medications: Prior to Admission medications   Medication Sig Start Date End Date Taking? Authorizing Provider  aspirin 325 MG tablet Take 325 mg by mouth 2 (two) times daily.   Yes  Historical Provider, MD  calcium-vitamin D (OSCAL WITH D) 500-200 MG-UNIT per tablet Take 1 tablet by mouth 2 (two) times daily.   Yes Historical Provider, MD    Allergies:   Allergies  Allergen Reactions  . Codeine Nausea And Vomiting    Social History:  reports that he has quit smoking. He does not have any smokeless tobacco history on file. He reports that he does not drink alcohol or use illicit drugs.  Family History: Family History  Problem Relation Age of Onset  . Coronary artery disease    . Stroke      Physical Exam: Filed Vitals:   10/06/11 1801 10/06/11 1949 10/06/11 2041 10/06/11 2357  BP: 153/77 144/71  107/71  Pulse: 106 104  93  Temp: 98.9 F (37.2 C) 100 F (37.8 C)  98.2 F (36.8 C)  TempSrc: Oral Oral  Oral  Resp: 23 19    SpO2: 93% 95% 94% 95%    General:  Alert and oriented times three, but intermittently confused, well developed and nourished, no acute distress Eyes: PERRLA, pink conjunctiva, no scleral icterus ENT: Moist oral mucosa, neck supple, no thyromegaly Lungs: clear to ascultation, no wheeze, no crackles, no use of accessory muscles Cardiovascular: regular rate and rhythm, no regurgitation, no gallops, no murmurs. No carotid bruits, no JVD Abdomen: soft, positive BS, non-tender, non-distended, no organomegaly, not an acute abdomen GU: not examined Neuro: CN II - XII grossly intact, sensation intact Musculoskeletal: strength 5/5 all extremities, no clubbing, cyanosis or edema Skin: no rash, no subcutaneous crepitation, no decubitus Psych: inappropriate patient   Labs on  Admission:   [facility] 10/06/11 2055  NA 135  K 4.1  CL 98  CO2 24  GLUCOSE 126*  BUN 12  CREATININE 0.84  CALCIUM 9.9  MG --  PHOS --   No results found for this basename: AST:2,ALT:2,ALKPHOS:2,BILITOT:2,PROT:2,ALBUMIN:2 in the last 72 hours No results found for this basename: LIPASE:2,AMYLASE:2 in the last 72 hours  Basename 10/06/11 2055 10/06/11 1622    WBC 25.7* 21.6*  NEUTROABS 20.0* --  HGB 14.7 13.7*  HCT 42.1 42.0*  MCV 87.3 88.3  PLT 511* --   No results found for this basename: CKTOTAL:3,CKMB:3,CKMBINDEX:3,TROPONINI:3 in the last 72 hours No components found with this basename: POCBNP:3 No results found for this basename: DDIMER:2 in the last 72 hours No results found for this basename: HGBA1C:2 in the last 72 hours No results found for this basename: CHOL:2,HDL:2,LDLCALC:2,TRIG:2,CHOLHDL:2,LDLDIRECT:2 in the last 72 hours No results found for this basename: TSH,T4TOTAL,FREET3,T3FREE,THYROIDAB in the last 72 hours No results found for this basename: VITAMINB12:2,FOLATE:2,FERRITIN:2,TIBC:2,IRON:2,RETICCTPCT:2 in the last 72 hours  Micro Results: No results found for this or any previous visit (from the past 240 hour(s)). Results for AISON, MALVEAUX (MRN [id number]) as of 10/07/2011 00:06  Ref. Range 10/06/2011 19:37  Color, Urine Latest Range: YELLOW  YELLOW  APPearance Latest Range: CLEAR  CLOUDY (A)  Specific Gravity, Urine Latest Range: 1.005-1.030  1.021  pH Latest Range: 5.0-8.0  6.5  Glucose, UA Latest Range: NEGATIVE mg/dL NEGATIVE  Bilirubin Urine Latest Range: NEGATIVE  NEGATIVE  Ketones, ur Latest Range: NEGATIVE mg/dL NEGATIVE  Protein Latest Range: NEGATIVE mg/dL 30 (A)  Urobilinogen, UA Latest Range: 0.0-1.0 mg/dL 1.0  Nitrite Latest Range: NEGATIVE  NEGATIVE  Leukocytes, UA Latest Range: NEGATIVE  TRACE (A)  Hgb urine dipstick Latest Range: NEGATIVE  TRACE (A)  Urine-Other No range found MUCOUS PRESENT  WBC, UA Latest Range: <3 WBC/hpf 0-2  RBC / HPF Latest Range: <3 RBC/hpf 3-6  Squamous Epithelial / LPF Latest Range: RARE  RARE  Bacteria, UA Latest Range: RARE  FEW (A)    Radiological Exams on Admission: Dg Chest 2 View  10/06/2011  *RADIOLOGY REPORT*  Clinical Data: Cough.  CHEST - 2 VIEW  Comparison: 07/24/2010.  Findings: The cardiac silhouette, mediastinal and hilar contours are within normal limits  and stable.  Bilateral pulmonary nodules are suspected.  Recommend further evaluation with chest CT.  No focal infiltrates, edema or effusions.  The bony thorax is intact.  IMPRESSION: Suspect bilateral pulmonary nodules worrisome for metastatic disease.  Recommend chest CT with contrast for further evaluation.  Original Report Authenticated By: P. Loralie Champagne, M.D.   Ct Chest W Contrast  10/06/2011  *RADIOLOGY REPORT*  Clinical Data: Chest congestion, fever, and wheezing.  Suspicion of pulmonary nodules on plain radiograph. Former smoker.  CT CHEST WITH CONTRAST  Technique:  Multidetector CT imaging of the chest was performed following the standard protocol during bolus administration of intravenous contrast.  Contrast: OMNIPAQUE IOHEXOL 300 MG/ML IJ SOLN  Comparison: Chest 10/06/2011  Findings: Multiple old somewhat poorly defined irregularly marginated ground-glass nodules scattered throughout the right lung.  Largest of these measures about 1.3 cm diameter.  Tiny nodules on the left, greatest size measuring 5 mm.  On the right, they are somewhat more confluent areas in the right middle lung which have the tree in bud pattern.  Changes could represent pulmonary metastasis or atypical nodular infectious process such as TB or fungal infection. Mildly enlarged right pretracheal, right hilar, and subcarinal lymph nodes  measuring up to 13 mm diameter. These could represent metastatic or reactive nodes.  Surgical clips in the left axilla.  No additional lymphadenopathy in the chest. Normal caliber thoracic aorta without aneurysm.  Normal heart size. Small esophageal hiatal hernia.  The esophagus is mostly decompressed without wall thickening.  No pleural effusion.  Mild dependent atelectasis in the lung bases.  Degenerative changes in the thoracic spine.  No destructive bone lesions appreciated.  IMPRESSION: Multiple poorly defined nodules scattered throughout the right lung. Mild right-sided mediastinal  lymphadenopathy.  Differential diagnosis includes metastasis, lymphoma, multi focal primary carcinoma, or atypical infectious process.  Original Report Authenticated By: Marlon Pel, M.D.    Assessment/Plan Present on Admission:  .Leukocytosis .Community acquired pneumonia Admit to MedSurg Blood cultures, sputum cultures ordered Antibiotics cefepime and vancomycin ordered Nebulizers and oxygen ordered Findings also concerning for malignancy, especially given the fact that this patient has a history of prostate cancer and melanoma. Tumor markers ordered  Patient wife advised that after treatment with antibiotics he will need to have repeat imaging done, to ensure resolution. patient confused, however, wife expresses understanding. .Encephalopathy Due to the above History of prostate cancer History of melanoma Were treated in remission  Full code DVT prophylaxis Team 4/Dr. Raquel Sarna, [place]aya Garr 10/07/2011, 12:03 AM

## 2011-10-07 NOTE — Progress Notes (Signed)
Pt has hx of MRSA, so MRSA PCR swab performed on admission. MRSA swab came back positive, pt already on contact precautions, protocol initiated. MD on call made aware, no new orders received.

## 2011-10-07 NOTE — Progress Notes (Signed)
Subjective: Patient states he feels a little better.   Objective: Vital signs in last 24 hours: Filed Vitals:   10/06/11 2041 10/06/11 2357 10/07/11 0145 10/07/11 0558  BP:  107/71 153/85 136/75  Pulse:  93 93 66  Temp:  98.2 F (36.8 C) 97.5 F (36.4 C) 97.8 F (36.6 C)  TempSrc:  Oral Oral Oral  Resp:   20 16  Height:   5\' 9"  (1.753 m)   Weight:   76.613 kg (168 lb 14.4 oz)   SpO2: 94% 95% 94% 97%    Intake/Output Summary (Last 24 hours) at 10/07/11 1037 Last data filed at 10/07/11 0600  Gross per 24 hour  Intake  237.5 ml  Output    600 ml  Net -362.5 ml    Weight change:   General: Alert, awake, oriented x3, in no acute distress. Heart: Regular rate and rhythm, without murmurs, rubs, gallops. Lungs: Clear to auscultation bilaterally. Abdomen: Soft, nontender, nondistended, positive bowel sounds. Extremities: No clubbing cyanosis or edema with positive pedal pulses.    Lab Results:  [facility] 10/07/11 0405 10/06/11 2055  NA 130* 135  K 3.7 4.1  CL 95* 98  CO2 24 24  GLUCOSE 226* 126*  BUN 14 12  CREATININE 0.87 0.84  CALCIUM 9.7 9.9  MG -- --  PHOS -- --   No results found for this basename: AST:2,ALT:2,ALKPHOS:2,BILITOT:2,PROT:2,ALBUMIN:2 in the last 72 hours No results found for this basename: LIPASE:2,AMYLASE:2 in the last 72 hours  Basename 10/07/11 0405 10/06/11 2055  WBC 26.1* 25.7*  NEUTROABS -- 20.0*  HGB 13.7 14.7  HCT 40.7 42.1  MCV 86.8 87.3  PLT 532* 511*   No results found for this basename: CKTOTAL:3,CKMB:3,CKMBINDEX:3,TROPONINI:3 in the last 72 hours No components found with this basename: POCBNP:3 No results found for this basename: DDIMER:2 in the last 72 hours No results found for this basename: HGBA1C:2 in the last 72 hours No results found for this basename: CHOL:2,HDL:2,LDLCALC:2,TRIG:2,CHOLHDL:2,LDLDIRECT:2 in the last 72 hours No results found for this basename: TSH,T4TOTAL,FREET3,T3FREE,THYROIDAB in the last 72 hours No  results found for this basename: VITAMINB12:2,FOLATE:2,FERRITIN:2,TIBC:2,IRON:2,RETICCTPCT:2 in the last 72 hours  Micro Results: Recent Results (from the past 240 hour(s))  MRSA PCR SCREENING     Status: Abnormal   Collection Time   10/07/11  2:09 AM      Component Value Range Status Comment   MRSA by PCR POSITIVE (*) NEGATIVE  Final   CULTURE, SPUTUM-ASSESSMENT     Status: Normal   Collection Time   10/07/11  2:09 AM      Component Value Range Status Comment   Specimen Description SPUTUM   Final    Special Requests NONE   Final    Sputum evaluation     Final    Value: THIS SPECIMEN IS ACCEPTABLE. RESPIRATORY CULTURE REPORT TO FOLLOW.   Report Status 10/07/2011 FINAL   Final     Studies/Results: Dg Chest 2 View  10/06/2011  *RADIOLOGY REPORT*  Clinical Data: Cough.  CHEST - 2 VIEW  Comparison: 07/24/2010.  Findings: The cardiac silhouette, mediastinal and hilar contours are within normal limits and stable.  Bilateral pulmonary nodules are suspected.  Recommend further evaluation with chest CT.  No focal infiltrates, edema or effusions.  The bony thorax is intact.  IMPRESSION: Suspect bilateral pulmonary nodules worrisome for metastatic disease.  Recommend chest CT with contrast for further evaluation.  Original Report Authenticated By: P. Loralie Champagne, M.D.   Ct Chest W Contrast  10/06/2011  *  RADIOLOGY REPORT*  Clinical Data: Chest congestion, fever, and wheezing.  Suspicion of pulmonary nodules on plain radiograph. Former smoker.  CT CHEST WITH CONTRAST  Technique:  Multidetector CT imaging of the chest was performed following the standard protocol during bolus administration of intravenous contrast.  Contrast: OMNIPAQUE IOHEXOL 300 MG/ML IJ SOLN  Comparison: Chest 10/06/2011  Findings: Multiple old somewhat poorly defined irregularly marginated ground-glass nodules scattered throughout the right lung.  Largest of these measures about 1.3 cm diameter.  Tiny nodules on the left,  greatest size measuring 5 mm.  On the right, they are somewhat more confluent areas in the right middle lung which have the tree in bud pattern.  Changes could represent pulmonary metastasis or atypical nodular infectious process such as TB or fungal infection. Mildly enlarged right pretracheal, right hilar, and subcarinal lymph nodes measuring up to 13 mm diameter. These could represent metastatic or reactive nodes.  Surgical clips in the left axilla.  No additional lymphadenopathy in the chest. Normal caliber thoracic aorta without aneurysm.  Normal heart size. Small esophageal hiatal hernia.  The esophagus is mostly decompressed without wall thickening.  No pleural effusion.  Mild dependent atelectasis in the lung bases.  Degenerative changes in the thoracic spine.  No destructive bone lesions appreciated.  IMPRESSION: Multiple poorly defined nodules scattered throughout the right lung. Mild right-sided mediastinal lymphadenopathy.  Differential diagnosis includes metastasis, lymphoma, multi focal primary carcinoma, or atypical infectious process.  Original Report Authenticated By: Marlon Pel, M.D.    Medications:     . sodium chloride   Intravenous STAT  . albuterol  5 mg Nebulization Once  . antiseptic oral rinse  15 mL Mouth Rinse BID  . azithromycin  500 mg Intravenous Once  . ceFEPime (MAXIPIME) IV  1 g Intravenous Q12H  . cefTRIAXone (ROCEPHIN)  IV  1 g Intravenous Once  . Chlorhexidine Gluconate Cloth  6 each Topical Q0600  . dextromethorphan-guaiFENesin  1 tablet Oral BID  . enoxaparin  40 mg Subcutaneous Q24H  . mupirocin ointment  1 application Nasal BID  . predniSONE  60 mg Oral Once  . vancomycin  1,000 mg Intravenous Q12H    Assessment: Principal Problem:  *Community acquired pneumonia Active Problems:  Leukocytosis  H/O prostate cancer  H/O melanoma excision  Encephalopathy   Plan: #1. Prob PNA Patient afebrile with increasing leukocytosis. Sputum pending.  Urine legionella antigen pending. Blood cultures pending. Check urine strep pneumococcus antigen. CT scan with abnormal findings of pulmonary nodules and LAD. Check influenza panel. Continue empiric IV vancomycin and cefepime. Mucinex, nebs PRN. Pulmonary consult for further eval.  #2. Leukocytosis Likely secondary to #1. Blood cx pending. Sputum pending. Urine legionella pending. Check urine pneumococcus antigen and influenza panel. Continue empiric IV vancomycin and cefepime.  #3. Encephalopathy Likely secondary to number 1. Improving. Follow.  #4 HTN Stable. Follow  #5. Hx prostateCA/Melanoma In remission  #6. Abn CT chest Pulm consult pending,.   LOS: 1 day   [facility] 10/07/2011, 10:37 AM

## 2011-10-08 DIAGNOSIS — R197 Diarrhea, unspecified: Secondary | ICD-10-CM

## 2011-10-08 DIAGNOSIS — D72829 Elevated white blood cell count, unspecified: Secondary | ICD-10-CM

## 2011-10-08 DIAGNOSIS — R9389 Abnormal findings on diagnostic imaging of other specified body structures: Secondary | ICD-10-CM | POA: Diagnosis present

## 2011-10-08 DIAGNOSIS — E871 Hypo-osmolality and hyponatremia: Secondary | ICD-10-CM | POA: Diagnosis present

## 2011-10-08 LAB — DIFFERENTIAL
Basophils Absolute: 0 10*3/uL (ref 0.0–0.1)
Basophils Relative: 0 % (ref 0–1)
Eosinophils Absolute: 0 10*3/uL (ref 0.0–0.7)
Lymphocytes Relative: 11 % — ABNORMAL LOW (ref 12–46)
Lymphs Abs: 3.2 10*3/uL (ref 0.7–4.0)
Monocytes Absolute: 2.6 10*3/uL — ABNORMAL HIGH (ref 0.1–1.0)
Neutro Abs: 23.4 10*3/uL — ABNORMAL HIGH (ref 1.7–7.7)

## 2011-10-08 LAB — CBC
MCHC: 33.6 g/dL (ref 30.0–36.0)
MCV: 87.4 fL (ref 78.0–100.0)
Platelets: 491 10*3/uL — ABNORMAL HIGH (ref 150–400)
RDW: 14.1 % (ref 11.5–15.5)
WBC: 29.2 10*3/uL — ABNORMAL HIGH (ref 4.0–10.5)

## 2011-10-08 LAB — BASIC METABOLIC PANEL
CO2: 24 mEq/L (ref 19–32)
Calcium: 9.2 mg/dL (ref 8.4–10.5)
Creatinine, Ser: 0.88 mg/dL (ref 0.50–1.35)
GFR calc non Af Amer: 80 mL/min — ABNORMAL LOW (ref >90)
Glucose, Bld: 131 mg/dL — ABNORMAL HIGH (ref 70–99)
Sodium: 129 mEq/L — ABNORMAL LOW (ref 135–145)

## 2011-10-08 LAB — URINALYSIS, ROUTINE W REFLEX MICROSCOPIC
Bilirubin Urine: NEGATIVE
Glucose, UA: NEGATIVE mg/dL
Hgb urine dipstick: NEGATIVE
Ketones, ur: NEGATIVE mg/dL
Nitrite: NEGATIVE
Specific Gravity, Urine: 1.016 (ref 1.005–1.030)
pH: 6.5 (ref 5.0–8.0)

## 2011-10-08 LAB — OSMOLALITY, URINE: Osmolality, Ur: 364 mOsm/kg — ABNORMAL LOW (ref 390–1090)

## 2011-10-08 MED ORDER — DEXTROSE 5 % IV SOLN
1.0000 g | Freq: Three times a day (TID) | INTRAVENOUS | Status: DC
  Administered 2011-10-08: 1 g via INTRAVENOUS
  Filled 2011-10-08 (×3): qty 1

## 2011-10-08 MED ORDER — LEVOFLOXACIN 500 MG PO TABS
500.0000 mg | ORAL_TABLET | Freq: Every day | ORAL | Status: DC
  Administered 2011-10-08 – 2011-10-10 (×3): 500 mg via ORAL
  Filled 2011-10-08 (×3): qty 1

## 2011-10-08 NOTE — Consult Note (Signed)
Date of Admission:  10/06/2011  Date of Consult:  10/08/2011  Reason for Consult:Leukocytosis Referring Physician: Thompson  Impression/Recommendation Lung Nodules Leukocytosis Would- check mycoplasma and chlamydia serologies. Check rheumatoid factor and ANA. Will need f/u imaging to re-asses risk of CA. Check AFB of sputum.  Change anbx to fit atypical serologies.  Comment- with his age, cancer is certainly a consideration. Infectious causes of nodular lung disease include mycoplasma, chlamydia, nocardia, actino and TB. He certainly could get actino or nocardia from his dentition.  He had a normal differential so a hematologic process seems less likely. Thank you for this interesting consult.  Glen Henry is an 78 y.o. male.  HPI: 78 yo M with hx of melanoma, prostate CA (prev seed implants ~5 yr ago), and sinus d/c for the last 3-4 months (thick like wool, with blood and dark clots).  Adm to hospital 10-06-11 with ~2 weeks hx of cough, fever and chills. In ED his temp was 100 and WBC was 21.6 but has increased in hospital. His CT scan of chest showed: " Multiple poorly defined nodules scattered throughout the right  lung. Mild right-sided mediastinal lymphadenopathy. Differential  diagnosis includes metastasis, lymphoma, multi focal primary carcinoma, or atypical infectious process." He was started on vanco and cefepime. He has had CEA, PSA, CA 19-9, AFP all negative.    Past Medical History  Diagnosis Date  . H/O prostate cancer     Seed implantation  . Hypertension   . H/O melanoma excision     Excised in 1988    Past Surgical History  Procedure Date  . Inguinal hernia repair     Bilaterally  ergies:   Allergies  Allergen Reactions  . Codeine Nausea And Vomiting    Medications:  Scheduled:   . antiseptic oral rinse  15 mL Mouth Rinse BID  . ceFEPime (MAXIPIME) IV  1 g Intravenous Q8H  . Chlorhexidine Gluconate Cloth  6 each Topical Q0600  .  dextromethorphan-guaiFENesin  1 tablet Oral BID  . enoxaparin  40 mg Subcutaneous Q24H  . mupirocin ointment  1 application Nasal BID  . vancomycin  1,000 mg Intravenous Q12H  . DISCONTD: ceFEPime (MAXIPIME) IV  1 g Intravenous Q12H    Social History:  reports that he has quit smoking. He does not have any smokeless tobacco history on file. He reports that he does not drink alcohol or use illicit drugs.  Family History  Problem Relation Age of Onset  . Coronary artery disease    . Stroke    Brother with Padget's disease, deceased.  General ROS: has "bowel problem" every winter (cramping, loose BM), loose BM and incontenence this AM after laxative; no hx of Tb exposures, no hx of incarceration, was in [employer] [phone] ([place]); wt down 10# over last 4 months; whole family sick with headache 3 weeks ago; normal urination. No lymphadenopathy. See hpi.   Blood pressure 144/79, pulse 70, temperature 97.7 F (36.5 C), temperature source Oral, resp. rate 20, height 5\' 9"  (1.753 m), weight 76.613 kg (168 lb 14.4 oz), SpO2 92.00%. General appearance: alert, cooperative, appears stated age and no distress Eyes: negative findings: pupils equal, round, reactive to light and accomodation Throat: normal findings: soft palate, uvula, and tonsils normal and abnormal findings: dentition: multiple carries Neck: no adenopathy and thyroid not enlarged, symmetric, no tenderness/mass/nodules Lungs: clear to auscultation bilaterally Chest wall: no lymphadenopathy Heart: regular rate and rhythm Abdomen: normal findings: bowel sounds normal and soft, non-tender Extremities: edema no  edema Lymph nodes: Cervical, supraclavicular, and axillary nodes normal. Neurologic: Mental status: Alert, oriented, thought content appropriate, recall 3/3 imediate, 2/5 after 5 minutes.  No joint swelling or tenderness.    Results for orders placed during the hospital encounter of 10/06/11 (from the past 48 hour(s))    URINALYSIS, ROUTINE W REFLEX MICROSCOPIC     Status: Abnormal   Collection Time   10/06/11  7:37 PM      Component Value Range Comment   Color, Urine YELLOW  YELLOW     APPearance CLOUDY (*) CLEAR     Specific Gravity, Urine 1.021  1.005 - 1.030     pH 6.5  5.0 - 8.0     Glucose, UA NEGATIVE  NEGATIVE (mg/dL)    Hgb urine dipstick TRACE (*) NEGATIVE     Bilirubin Urine NEGATIVE  NEGATIVE     Ketones, ur NEGATIVE  NEGATIVE (mg/dL)    Protein, ur 30 (*) NEGATIVE (mg/dL)    Urobilinogen, UA 1.0  0.0 - 1.0 (mg/dL)    Nitrite NEGATIVE  NEGATIVE     Leukocytes, UA TRACE (*) NEGATIVE    URINE MICROSCOPIC-ADD ON     Status: Abnormal   Collection Time   10/06/11  7:37 PM      Component Value Range Comment   Squamous Epithelial / LPF RARE  RARE     WBC, UA 0-2  <3 (WBC/hpf)    RBC / HPF 3-6  <3 (RBC/hpf)    Bacteria, UA FEW (*) RARE     Urine-Other MUCOUS PRESENT     CBC     Status: Abnormal   Collection Time   10/06/11  8:55 PM      Component Value Range Comment   WBC 25.7 (*) 4.0 - 10.5 (K/uL)    RBC 4.82  4.22 - 5.81 (MIL/uL)    Hemoglobin 14.7  13.0 - 17.0 (g/dL)    HCT 86.5  78.4 - 69.6 (%)    MCV 87.3  78.0 - 100.0 (fL)    MCH 30.5  26.0 - 34.0 (pg)    MCHC 34.9  30.0 - 36.0 (g/dL)    RDW 29.5  28.4 - 13.2 (%)    Platelets 511 (*) 150 - 400 (K/uL)   DIFFERENTIAL     Status: Abnormal   Collection Time   10/06/11  8:55 PM      Component Value Range Comment   Neutrophils Relative 78 (*) 43 - 77 (%)    Lymphocytes Relative 14  12 - 46 (%)    Monocytes Relative 8  3 - 12 (%)    Eosinophils Relative 0  0 - 5 (%)    Basophils Relative 0  0 - 1 (%)    Neutro Abs 20.0 (*) 1.7 - 7.7 (K/uL)    Lymphs Abs 3.6  0.7 - 4.0 (K/uL)    Monocytes Absolute 2.1 (*) 0.1 - 1.0 (K/uL)    Eosinophils Absolute 0.0  0.0 - 0.7 (K/uL)    Basophils Absolute 0.0  0.0 - 0.1 (K/uL)    WBC Morphology TOXIC GRANULATION     BASIC METABOLIC PANEL     Status: Abnormal   Collection Time   10/06/11  8:55 PM       Component Value Range Comment   Sodium 135  135 - 145 (mEq/L)    Potassium 4.1  3.5 - 5.1 (mEq/L)    Chloride 98  96 - 112 (mEq/L)    CO2 24  19 -  32 (mEq/L)    Glucose, Bld 126 (*) 70 - 99 (mg/dL)    BUN 12  6 - 23 (mg/dL)    Creatinine, Ser 1.61  0.50 - 1.35 (mg/dL)    Calcium 9.9  8.4 - 10.5 (mg/dL)    GFR calc non Af Amer 82 (*) >90 (mL/min)    GFR calc Af Amer >90  >90 (mL/min)   CULTURE, BLOOD (ROUTINE X 2)     Status: Normal (Preliminary result)   Collection Time   10/06/11  9:00 PM      Component Value Range Comment   Specimen Description BLOOD L HAND      Special Requests BOTTLES DRAWN AEROBIC AND ANAEROBIC 4CC      Culture  Setup Time [id number]      Culture        Value:        BLOOD CULTURE RECEIVED NO GROWTH TO DATE CULTURE WILL BE HELD FOR 5 DAYS BEFORE ISSUING A FINAL NEGATIVE REPORT   Report Status PENDING     CULTURE, BLOOD (ROUTINE X 2)     Status: Normal (Preliminary result)   Collection Time   10/07/11 12:55 AM      Component Value Range Comment   Specimen Description BLOOD LEFT ANTECUBITAL      Special Requests        Value: BOTTLES DRAWN AEROBIC AND ANAEROBIC 5CC AEROBIC/3CC ANAEROBIC   Culture  Setup Time [id number]      Culture        Value:        BLOOD CULTURE RECEIVED NO GROWTH TO DATE CULTURE WILL BE HELD FOR 5 DAYS BEFORE ISSUING A FINAL NEGATIVE REPORT   Report Status PENDING     CULTURE, RESPIRATORY     Status: Normal (Preliminary result)   Collection Time   10/07/11  2:00 AM      Component Value Range Comment   Specimen Description SPUTUM      Special Requests NONE      Gram Stain        Value: FEW WBC PRESENT, PREDOMINANTLY PMN     NO SQUAMOUS EPITHELIAL CELLS SEEN     FEW GRAM POSITIVE COCCI     IN PAIRS IN CLUSTERS RARE GRAM POSITIVE RODS     RARE GRAM NEGATIVE RODS   Culture Culture reincubated for better growth      Report Status PENDING     MRSA PCR SCREENING     Status: Abnormal   Collection Time   10/07/11  2:09 AM       Component Value Range Comment   MRSA by PCR POSITIVE (*) NEGATIVE    CULTURE, SPUTUM-ASSESSMENT     Status: Normal   Collection Time   10/07/11  2:09 AM      Component Value Range Comment   Specimen Description SPUTUM      Special Requests NONE      Sputum evaluation        Value: THIS SPECIMEN IS ACCEPTABLE. RESPIRATORY CULTURE REPORT TO FOLLOW.   Report Status 10/07/2011 FINAL     LEGIONELLA ANTIGEN, URINE     Status: Normal   Collection Time   10/07/11  2:40 AM      Component Value Range Comment   Specimen Description URINE, CLEAN CATCH      Special Requests NONE      Legionella Antigen, Urine Negative for Legionella pneumophilia serogroup 1      Report Status 10/07/2011 FINAL  PSA     Status: Abnormal   Collection Time   10/07/11  4:05 AM      Component Value Range Comment   PSA 0.02 (*) <=4.00 (ng/mL)   TSH     Status: Normal   Collection Time   10/07/11  4:05 AM      Component Value Range Comment   TSH 0.547  0.350 - 4.500 (uIU/mL)   CEA     Status: Normal   Collection Time   10/07/11  4:05 AM      Component Value Range Comment   CEA 0.7  0.0 - 5.0 (ng/mL)   AFP TUMOR MARKER     Status: Normal   Collection Time   10/07/11  4:05 AM      Component Value Range Comment   AFP-Tumor Marker <1.3  0.0 - 8.0 (ng/mL)   CANCER ANTIGEN 19-9     Status: Abnormal   Collection Time   10/07/11  4:05 AM      Component Value Range Comment   CA 19-9 11.0 (*) <35.0 (U/mL)   CBC     Status: Abnormal   Collection Time   10/07/11  4:05 AM      Component Value Range Comment   WBC 26.1 (*) 4.0 - 10.5 (K/uL)    RBC 4.69  4.22 - 5.81 (MIL/uL)    Hemoglobin 13.7  13.0 - 17.0 (g/dL)    HCT 40.9  81.1 - 91.4 (%)    MCV 86.8  78.0 - 100.0 (fL)    MCH 29.2  26.0 - 34.0 (pg)    MCHC 33.7  30.0 - 36.0 (g/dL)    RDW 78.2  95.6 - 21.3 (%)    Platelets 532 (*) 150 - 400 (K/uL)   BASIC METABOLIC PANEL     Status: Abnormal   Collection Time   10/07/11  4:05 AM      Component Value Range Comment   Sodium  130 (*) 135 - 145 (mEq/L)    Potassium 3.7  3.5 - 5.1 (mEq/L)    Chloride 95 (*) 96 - 112 (mEq/L)    CO2 24  19 - 32 (mEq/L)    Glucose, Bld 226 (*) 70 - 99 (mg/dL)    BUN 14  6 - 23 (mg/dL)    Creatinine, Ser 0.86  0.50 - 1.35 (mg/dL)    Calcium 9.7  8.4 - 10.5 (mg/dL)    GFR calc non Af Amer 81 (*) >90 (mL/min)    GFR calc Af Amer >90  >90 (mL/min)   INFLUENZA PANEL BY PCR     Status: Normal   Collection Time   10/07/11 10:50 AM      Component Value Range Comment   Influenza A By PCR NEGATIVE  NEGATIVE     Influenza B By PCR NEGATIVE  NEGATIVE     H1N1 flu by pcr NOT DETECTED  NOT DETECTED    SEDIMENTATION RATE     Status: Abnormal   Collection Time   10/07/11  5:18 PM      Component Value Range Comment   Sed Rate 45 (*) 0 - 16 (mm/hr)   PROCALCITONIN     Status: Normal   Collection Time   10/07/11  5:23 PM      Component Value Range Comment   Procalcitonin 1.05     CBC     Status: Abnormal   Collection Time   10/08/11  4:20 AM      Component Value  Range Comment   WBC 29.2 (*) 4.0 - 10.5 (K/uL)    RBC 4.29  4.22 - 5.81 (MIL/uL)    Hemoglobin 12.6 (*) 13.0 - 17.0 (g/dL)    HCT 95.2 (*) 84.1 - 52.0 (%)    MCV 87.4  78.0 - 100.0 (fL)    MCH 29.4  26.0 - 34.0 (pg)    MCHC 33.6  30.0 - 36.0 (g/dL)    RDW 32.4  40.1 - 02.7 (%)    Platelets 491 (*) 150 - 400 (K/uL)   BASIC METABOLIC PANEL     Status: Abnormal   Collection Time   10/08/11  4:20 AM      Component Value Range Comment   Sodium 129 (*) 135 - 145 (mEq/L)    Potassium 4.0  3.5 - 5.1 (mEq/L)    Chloride 95 (*) 96 - 112 (mEq/L)    CO2 24  19 - 32 (mEq/L)    Glucose, Bld 131 (*) 70 - 99 (mg/dL)    BUN 15  6 - 23 (mg/dL)    Creatinine, Ser 2.53  0.50 - 1.35 (mg/dL)    Calcium 9.2  8.4 - 10.5 (mg/dL)    GFR calc non Af Amer 80 (*) >90 (mL/min)    GFR calc Af Amer >90  >90 (mL/min)   DIFFERENTIAL     Status: Abnormal   Collection Time   10/08/11  4:20 AM      Component Value Range Comment   Neutrophils Relative 80 (*) 43  - 77 (%)    Lymphocytes Relative 11 (*) 12 - 46 (%)    Monocytes Relative 9  3 - 12 (%)    Eosinophils Relative 0  0 - 5 (%)    Basophils Relative 0  0 - 1 (%)    Neutro Abs 23.4 (*) 1.7 - 7.7 (K/uL)    Lymphs Abs 3.2  0.7 - 4.0 (K/uL)    Monocytes Absolute 2.6 (*) 0.1 - 1.0 (K/uL)    Eosinophils Absolute 0.0  0.0 - 0.7 (K/uL)    Basophils Absolute 0.0  0.0 - 0.1 (K/uL)    Smear Review MORPHOLOGY UNREMARKABLE     STREP PNEUMONIAE URINARY ANTIGEN     Status: Normal   Collection Time   10/08/11 10:37 AM      Component Value Range Comment   Strep Pneumo Urinary Antigen NEGATIVE  NEGATIVE    VANCOMYCIN, TROUGH     Status: Normal   Collection Time   10/08/11  2:04 PM      Component Value Range Comment   Vancomycin Tr 16.1  10.0 - 20.0 (ug/mL)   URINALYSIS, ROUTINE W REFLEX MICROSCOPIC     Status: Normal   Collection Time   10/08/11  2:07 PM      Component Value Range Comment   Color, Urine YELLOW  YELLOW     APPearance CLEAR  CLEAR     Specific Gravity, Urine 1.016  1.005 - 1.030     pH 6.5  5.0 - 8.0     Glucose, UA NEGATIVE  NEGATIVE (mg/dL)    Hgb urine dipstick NEGATIVE  NEGATIVE     Bilirubin Urine NEGATIVE  NEGATIVE     Ketones, ur NEGATIVE  NEGATIVE (mg/dL)    Protein, ur NEGATIVE  NEGATIVE (mg/dL)    Urobilinogen, UA 0.2  0.0 - 1.0 (mg/dL)    Nitrite NEGATIVE  NEGATIVE     Leukocytes, UA NEGATIVE  NEGATIVE  MICROSCOPIC NOT DONE ON URINES  WITH NEGATIVE PROTEIN, BLOOD, LEUKOCYTES, NITRITE, OR GLUCOSE <1000 mg/dL.      Component Value Date/Time   SDES URINE, CLEAN CATCH 10/07/2011 0240   SPECREQUEST NONE 10/07/2011 0240   CULT Culture reincubated for better growth 10/07/2011 0200   REPTSTATUS 10/07/2011 FINAL 10/07/2011 0240   Ct Chest W Contrast  10/06/2011  *RADIOLOGY REPORT*  Clinical Data: Chest congestion, fever, and wheezing.  Suspicion of pulmonary nodules on plain radiograph. Former smoker.  CT CHEST WITH CONTRAST  Technique:  Multidetector CT imaging of the chest was  performed following the standard protocol during bolus administration of intravenous contrast.  Contrast: OMNIPAQUE IOHEXOL 300 MG/ML IJ SOLN  Comparison: Chest 10/06/2011  Findings: Multiple old somewhat poorly defined irregularly marginated ground-glass nodules scattered throughout the right lung.  Largest of these measures about 1.3 cm diameter.  Tiny nodules on the left, greatest size measuring 5 mm.  On the right, they are somewhat more confluent areas in the right middle lung which have the tree in bud pattern.  Changes could represent pulmonary metastasis or atypical nodular infectious process such as TB or fungal infection. Mildly enlarged right pretracheal, right hilar, and subcarinal lymph nodes measuring up to 13 mm diameter. These could represent metastatic or reactive nodes.  Surgical clips in the left axilla.  No additional lymphadenopathy in the chest. Normal caliber thoracic aorta without aneurysm.  Normal heart size. Small esophageal hiatal hernia.  The esophagus is mostly decompressed without wall thickening.  No pleural effusion.  Mild dependent atelectasis in the lung bases.  Degenerative changes in the thoracic spine.  No destructive bone lesions appreciated.  IMPRESSION: Multiple poorly defined nodules scattered throughout the right lung. Mild right-sided mediastinal lymphadenopathy.  Differential diagnosis includes metastasis, lymphoma, multi focal primary carcinoma, or atypical infectious process.  Original Report Authenticated By: Marlon Pel, M.D.    Thank you so much for this interesting consult,   Glen Henry [phone] 10/08/2011, 5:26 PM

## 2011-10-08 NOTE — Progress Notes (Signed)
Brief Note: Vancomycin  Please see earlier note by Earl Many, PharmD for more info.  Vancomycin Trough at steady state returns as 16.1 (goal 15-20).  Timing appropriate and next dose already hung by RN.    Plan:  Continue Vancomycin at 1gm IV q12h.    Geoffry Paradise, PharmD.   Pager:  [phone] 3:18 PM

## 2011-10-08 NOTE — Progress Notes (Signed)
Subjective: Patient states he feels a little better. Patient had dulcolax yesterday and per nursing some loose stools. Patient states it was just "the squirts" and states stool is more formed today.Patient did state had bouts of diarrhea in [place] and February. Per wife mentation improving.  Objective: Vital signs in last 24 hours: Filed Vitals:   10/07/11 0558 10/07/11 1358 10/07/11 2229 10/08/11 0600  BP: 136/75 137/70 159/81 127/76  Pulse: 66 77 99 75  Temp: 97.8 F (36.6 C) 98.3 F (36.8 C) 98.6 F (37 C) 97.8 F (36.6 C)  TempSrc: Oral Oral Oral Oral  Resp: 16 18 18 16   Height:      Weight:      SpO2: 97% 92% 94% 94%    Intake/Output Summary (Last 24 hours) at 10/08/11 1304 Last data filed at 10/08/11 0920  Gross per 24 hour  Intake    840 ml  Output   1053 ml  Net   -213 ml    Weight change:   General: Alert, awake, oriented x3, in no acute distress. Heart: Regular rate and rhythm, without murmurs, rubs, gallops. Lungs: Some coarse BS Abdomen: Soft, nontender, nondistended, positive bowel sounds. Extremities: No clubbing cyanosis or edema with positive pedal pulses.    Lab Results:  Basename 10/08/11 0420 10/07/11 0405  NA 129* 130*  K 4.0 3.7  CL 95* 95*  CO2 24 24  GLUCOSE 131* 226*  BUN 15 14  CREATININE 0.88 0.87  CALCIUM 9.2 9.7  MG -- --  PHOS -- --   No results found for this basename: AST:2,ALT:2,ALKPHOS:2,BILITOT:2,PROT:2,ALBUMIN:2 in the last 72 hours No results found for this basename: LIPASE:2,AMYLASE:2 in the last 72 hours  Basename 10/08/11 0420 10/07/11 0405 10/06/11 2055  WBC 29.2* 26.1* --  NEUTROABS 23.4* -- 20.0*  HGB 12.6* 13.7 --  HCT 37.5* 40.7 --  MCV 87.4 86.8 --  PLT 491* 532* --   No results found for this basename: CKTOTAL:3,CKMB:3,CKMBINDEX:3,TROPONINI:3 in the last 72 hours No components found with this basename: POCBNP:3 No results found for this basename: DDIMER:2 in the last 72 hours No results found for this  basename: HGBA1C:2 in the last 72 hours No results found for this basename: CHOL:2,HDL:2,LDLCALC:2,TRIG:2,CHOLHDL:2,LDLDIRECT:2 in the last 72 hours  Basename 10/07/11 0405  TSH 0.547  T4TOTAL --  T3FREE --  THYROIDAB --   No results found for this basename: VITAMINB12:2,FOLATE:2,FERRITIN:2,TIBC:2,IRON:2,RETICCTPCT:2 in the last 72 hours  Micro Results: Recent Results (from the past 240 hour(s))  CULTURE, BLOOD (ROUTINE X 2)     Status: Normal (Preliminary result)   Collection Time   10/06/11  9:00 PM      Component Value Range Status Comment   Specimen Description BLOOD L HAND   Final    Special Requests BOTTLES DRAWN AEROBIC AND ANAEROBIC 4CC   Final    Culture  Setup Time [id number]   Final    Culture     Final    Value:        BLOOD CULTURE RECEIVED NO GROWTH TO DATE CULTURE WILL BE HELD FOR 5 DAYS BEFORE ISSUING A FINAL NEGATIVE REPORT   Report Status PENDING   Incomplete   CULTURE, BLOOD (ROUTINE X 2)     Status: Normal (Preliminary result)   Collection Time   10/07/11 12:55 AM      Component Value Range Status Comment   Specimen Description BLOOD LEFT ANTECUBITAL   Final    Special Requests     Final  Value: BOTTLES DRAWN AEROBIC AND ANAEROBIC 5CC AEROBIC/3CC ANAEROBIC   Culture  Setup Time [id number]   Final    Culture     Final    Value:        BLOOD CULTURE RECEIVED NO GROWTH TO DATE CULTURE WILL BE HELD FOR 5 DAYS BEFORE ISSUING A FINAL NEGATIVE REPORT   Report Status PENDING   Incomplete   CULTURE, RESPIRATORY     Status: Normal (Preliminary result)   Collection Time   10/07/11  2:00 AM      Component Value Range Status Comment   Specimen Description SPUTUM   Final    Special Requests NONE   Final    Gram Stain     Final    Value: FEW WBC PRESENT, PREDOMINANTLY PMN     NO SQUAMOUS EPITHELIAL CELLS SEEN     FEW GRAM POSITIVE COCCI     IN PAIRS IN CLUSTERS RARE GRAM POSITIVE RODS     RARE GRAM NEGATIVE RODS   Culture Culture reincubated for better growth    Final    Report Status PENDING   Incomplete   MRSA PCR SCREENING     Status: Abnormal   Collection Time   10/07/11  2:09 AM      Component Value Range Status Comment   MRSA by PCR POSITIVE (*) NEGATIVE  Final   CULTURE, SPUTUM-ASSESSMENT     Status: Normal   Collection Time   10/07/11  2:09 AM      Component Value Range Status Comment   Specimen Description SPUTUM   Final    Special Requests NONE   Final    Sputum evaluation     Final    Value: THIS SPECIMEN IS ACCEPTABLE. RESPIRATORY CULTURE REPORT TO FOLLOW.   Report Status 10/07/2011 FINAL   Final     Studies/Results: Dg Chest 2 View  10/06/2011  *RADIOLOGY REPORT*  Clinical Data: Cough.  CHEST - 2 VIEW  Comparison: 07/24/2010.  Findings: The cardiac silhouette, mediastinal and hilar contours are within normal limits and stable.  Bilateral pulmonary nodules are suspected.  Recommend further evaluation with chest CT.  No focal infiltrates, edema or effusions.  The bony thorax is intact.  IMPRESSION: Suspect bilateral pulmonary nodules worrisome for metastatic disease.  Recommend chest CT with contrast for further evaluation.  Original Report Authenticated By: P. Loralie Champagne, M.D.   Ct Chest W Contrast  10/06/2011  *RADIOLOGY REPORT*  Clinical Data: Chest congestion, fever, and wheezing.  Suspicion of pulmonary nodules on plain radiograph. Former smoker.  CT CHEST WITH CONTRAST  Technique:  Multidetector CT imaging of the chest was performed following the standard protocol during bolus administration of intravenous contrast.  Contrast: OMNIPAQUE IOHEXOL 300 MG/ML IJ SOLN  Comparison: Chest 10/06/2011  Findings: Multiple old somewhat poorly defined irregularly marginated ground-glass nodules scattered throughout the right lung.  Largest of these measures about 1.3 cm diameter.  Tiny nodules on the left, greatest size measuring 5 mm.  On the right, they are somewhat more confluent areas in the right middle lung which have the tree in bud  pattern.  Changes could represent pulmonary metastasis or atypical nodular infectious process such as TB or fungal infection. Mildly enlarged right pretracheal, right hilar, and subcarinal lymph nodes measuring up to 13 mm diameter. These could represent metastatic or reactive nodes.  Surgical clips in the left axilla.  No additional lymphadenopathy in the chest. Normal caliber thoracic aorta without aneurysm.  Normal heart size. Small  esophageal hiatal hernia.  The esophagus is mostly decompressed without wall thickening.  No pleural effusion.  Mild dependent atelectasis in the lung bases.  Degenerative changes in the thoracic spine.  No destructive bone lesions appreciated.  IMPRESSION: Multiple poorly defined nodules scattered throughout the right lung. Mild right-sided mediastinal lymphadenopathy.  Differential diagnosis includes metastasis, lymphoma, multi focal primary carcinoma, or atypical infectious process.  Original Report Authenticated By: Marlon Pel, M.D.    Medications:     . sodium chloride   Intravenous STAT  . antiseptic oral rinse  15 mL Mouth Rinse BID  . ceFEPime (MAXIPIME) IV  1 g Intravenous Q8H  . Chlorhexidine Gluconate Cloth  6 each Topical Q0600  . dextromethorphan-guaiFENesin  1 tablet Oral BID  . enoxaparin  40 mg Subcutaneous Q24H  . mupirocin ointment  1 application Nasal BID  . vancomycin  1,000 mg Intravenous Q12H  . DISCONTD: ceFEPime (MAXIPIME) IV  1 g Intravenous Q12H    Assessment: Principal Problem:  *Community acquired pneumonia Active Problems:  Leukocytosis  H/O prostate cancer  H/O melanoma excision  Encephalopathy  Hyponatremia  Abnormal CT scan, chest  Diarrhea   Plan: #1. Prob PNA/URI sxs Patient afebrile with increasing leukocytosis. Sputum pending. Urine legionella antigen negative. Blood cultures pending. Check urine strep pneumococcus antigen pending. CT scan with abnormal findings of pulmonary nodules and LAD. Check influenza  panel. Continue empiric IV vancomycin and cefepime. Mucinex, nebs PRN. Pulmonary ff and recommend outpatient PET scan and ff by biopsy.  #2. Leukocytosis ?? Etiology. WBC worsening. Blood cx pending. Sputum pending. Urine legionella negative. Urine pneumococcus antigen pending and influenza panel negative. Continue empiric IV vancomycin and cefepime. Will check a cdiff PCR. ID consult for further eval and rxcs. Repeat UA.  #3. Encephalopathy Likely secondary to number 1. Improving. Follow.  #4 HTN Stable. Follow  #5. Hx prostateCA/Melanoma In remission  #6. Abn CT chest  Per pulm will need outpatient PET scan ff by biopsy.  #7 ??Diarrhea Patient states had diarrhea in Jan and Feb. Patient was given stool softener yesterday and had loose stool. Due to increasing WBC check cdiff PCR.  #8. Hyponatremia Likely secondary to dehydration. Check urine sodium and creatinine and serum and urine osmolality.Increase IVF. Follow.   LOS: 2 days   Nicklas Mcsweeney 10/08/2011, 1:04 PM

## 2011-10-08 NOTE — Progress Notes (Signed)
Pt asked for ducolax earlier in the night to have a BM. After ducolax, he c/o cramping and and I gave him maalox per order. He then had multiple episodes of diarrhea and asked for imodium. The on call provider refused to give imodium. Pt fell asleep. Will continue to monitor. Eugene Garnet [facility] 10/08/2011 8:14 AM

## 2011-10-08 NOTE — Progress Notes (Signed)
ANTIBIOTIC CONSULT NOTE - Follow-Up  Pharmacy Consult for Vancomycin/Cefepime Indication: rule out pneumonia  Allergies  Allergen Reactions  . Codeine Nausea And Vomiting    Patient Measurements: Height: 5\' 9"  (175.3 cm) Weight: 168 lb 14.4 oz (76.613 kg) IBW/kg (Calculated) : 70.7    Vital Signs: Temp: 97.8 F (36.6 C) (04/02 0600) Temp src: Oral (04/02 0600) BP: 127/76 mmHg (04/02 0600) Pulse Rate: 75  (04/02 0600) Intake/Output from previous day: 04/01 0701 - 04/02 0700 In: 840 [P.O.:840] Out: 853 [Urine:850; Stool:3] Intake/Output from this shift:    Labs:  Basename 10/08/11 0420 10/07/11 0405 10/06/11 2055  WBC 29.2* 26.1* 25.7*  HGB 12.6* 13.7 14.7  PLT 491* 532* 511*  LABCREA -- -- --  CREATININE 0.88 0.87 0.84   Estimated Creatinine Clearance: 69.2 ml/min (by C-G formula based on Cr of 0.88). No results found for this basename: VANCOTROUGH:2,VANCOPEAK:2,VANCORANDOM:2,GENTTROUGH:2,GENTPEAK:2,GENTRANDOM:2,TOBRATROUGH:2,TOBRAPEAK:2,TOBRARND:2,AMIKACINPEAK:2,AMIKACINTROU:2,AMIKACIN:2, in the last 72 hours   Microbiology: Recent Results (from the past 720 hour(s))  CULTURE, BLOOD (ROUTINE X 2)     Status: Normal (Preliminary result)   Collection Time   10/06/11  9:00 PM      Component Value Range Status Comment   Specimen Description BLOOD L HAND   Final    Special Requests BOTTLES DRAWN AEROBIC AND ANAEROBIC 4CC   Final    Culture  Setup Time [id number]   Final    Culture     Final    Value:        BLOOD CULTURE RECEIVED NO GROWTH TO DATE CULTURE WILL BE HELD FOR 5 DAYS BEFORE ISSUING A FINAL NEGATIVE REPORT   Report Status PENDING   Incomplete   CULTURE, BLOOD (ROUTINE X 2)     Status: Normal (Preliminary result)   Collection Time   10/07/11 12:55 AM      Component Value Range Status Comment   Specimen Description BLOOD LEFT ANTECUBITAL   Final    Special Requests     Final    Value: BOTTLES DRAWN AEROBIC AND ANAEROBIC 5CC AEROBIC/3CC ANAEROBIC   Culture  Setup Time [id number]   Final    Culture     Final    Value:        BLOOD CULTURE RECEIVED NO GROWTH TO DATE CULTURE WILL BE HELD FOR 5 DAYS BEFORE ISSUING A FINAL NEGATIVE REPORT   Report Status PENDING   Incomplete   CULTURE, RESPIRATORY     Status: Normal (Preliminary result)   Collection Time   10/07/11  2:00 AM      Component Value Range Status Comment   Specimen Description SPUTUM   Final    Special Requests NONE   Final    Gram Stain     Final    Value: FEW WBC PRESENT, PREDOMINANTLY PMN     NO SQUAMOUS EPITHELIAL CELLS SEEN     FEW GRAM POSITIVE COCCI     IN PAIRS IN CLUSTERS RARE GRAM POSITIVE RODS     RARE GRAM NEGATIVE RODS   Culture PENDING   Incomplete    Report Status PENDING   Incomplete   MRSA PCR SCREENING     Status: Abnormal   Collection Time   10/07/11  2:09 AM      Component Value Range Status Comment   MRSA by PCR POSITIVE (*) NEGATIVE  Final   CULTURE, SPUTUM-ASSESSMENT     Status: Normal   Collection Time   10/07/11  2:09 AM      Component Value Range Status  Comment   Specimen Description SPUTUM   Final    Special Requests NONE   Final    Sputum evaluation     Final    Value: THIS SPECIMEN IS ACCEPTABLE. RESPIRATORY CULTURE REPORT TO FOLLOW.   Report Status 10/07/2011 FINAL   Final     Medical History: Past Medical History  Diagnosis Date  . H/O prostate cancer     Seed implantation  . Hypertension   . H/O melanoma excision     Excised in 1988    Medications:  Scheduled:     . sodium chloride   Intravenous STAT  . antiseptic oral rinse  15 mL Mouth Rinse BID  . ceFEPime (MAXIPIME) IV  1 g Intravenous Q12H  . Chlorhexidine Gluconate Cloth  6 each Topical Q0600  . dextromethorphan-guaiFENesin  1 tablet Oral BID  . enoxaparin  40 mg Subcutaneous Q24H  . mupirocin ointment  1 application Nasal BID  . vancomycin  1,000 mg Intravenous Q12H   Infusions:     . sodium chloride 75 mL/hr at 10/07/11 1610   Assessment:  78 year old man  with chief complaint of shortness of breath.  Reportedly ill for past two weeks.  Rocephin 1gm IV x 1 given in ED ~ 1am today.  Zithromax 500mg  IV x 1 also ordered in ED  Pt with history of prostate CA and melanoma; chest xray shows findings concerning for malignancy  IV Vancomycin and Cefepime Day 2 for suspected PNA, elevated WBC  Goal of Therapy:  Vancomycin trough level 15-20 mcg/ml  Plan:   Continue Vancomycin 1000mg  IV q12h  Given rise in WBC, will change Cefepime from 1g q12 to 1g q8  Follow cultures & sensitivities  Check vancomycin trough level today prior to next dose  Hessie Knows, PharmD, BCPS pager [phone] 10/08/2011 8:39 AM

## 2011-10-09 DIAGNOSIS — J9600 Acute respiratory failure, unspecified whether with hypoxia or hypercapnia: Secondary | ICD-10-CM

## 2011-10-09 DIAGNOSIS — R911 Solitary pulmonary nodule: Secondary | ICD-10-CM

## 2011-10-09 LAB — BASIC METABOLIC PANEL
CO2: 28 mEq/L (ref 19–32)
Calcium: 9.4 mg/dL (ref 8.4–10.5)
Chloride: 96 mEq/L (ref 96–112)
Creatinine, Ser: 0.92 mg/dL (ref 0.50–1.35)
Glucose, Bld: 130 mg/dL — ABNORMAL HIGH (ref 70–99)

## 2011-10-09 LAB — CLOSTRIDIUM DIFFICILE BY PCR: Toxigenic C. Difficile by PCR: POSITIVE — AB

## 2011-10-09 LAB — URINE CULTURE
Colony Count: NO GROWTH
Culture  Setup Time: 201304022059

## 2011-10-09 LAB — CBC
HCT: 38.9 % — ABNORMAL LOW (ref 39.0–52.0)
MCH: 29.6 pg (ref 26.0–34.0)
MCV: 87.8 fL (ref 78.0–100.0)
RBC: 4.43 MIL/uL (ref 4.22–5.81)
RDW: 14 % (ref 11.5–15.5)
WBC: 20.3 10*3/uL — ABNORMAL HIGH (ref 4.0–10.5)

## 2011-10-09 LAB — DIFFERENTIAL
Eosinophils Absolute: 0.1 10*3/uL (ref 0.0–0.7)
Eosinophils Relative: 0 % (ref 0–5)
Lymphocytes Relative: 12 % (ref 12–46)
Lymphs Abs: 2.4 10*3/uL (ref 0.7–4.0)
Monocytes Absolute: 1.9 10*3/uL — ABNORMAL HIGH (ref 0.1–1.0)

## 2011-10-09 LAB — ANA: Anti Nuclear Antibody(ANA): NEGATIVE

## 2011-10-09 LAB — RHEUMATOID FACTOR: Rhuematoid fact SerPl-aCnc: <10 IU/mL (ref <=14)

## 2011-10-09 LAB — SODIUM, URINE, RANDOM: Sodium, Ur: 66 mEq/L

## 2011-10-09 MED ORDER — VANCOMYCIN 50 MG/ML ORAL SOLUTION
125.0000 mg | Freq: Four times a day (QID) | ORAL | Status: DC
  Administered 2011-10-09 – 2011-10-10 (×3): 125 mg via ORAL
  Filled 2011-10-09 (×6): qty 2.5

## 2011-10-09 MED ORDER — VANCOMYCIN 50 MG/ML ORAL SOLUTION
125.0000 mg | Freq: Four times a day (QID) | ORAL | Status: DC
  Administered 2011-10-09: 125 mg via ORAL
  Filled 2011-10-09 (×7): qty 2.5

## 2011-10-09 MED ORDER — HYDRALAZINE HCL 20 MG/ML IJ SOLN
10.0000 mg | INTRAMUSCULAR | Status: DC | PRN
  Administered 2011-10-09: 10 mg via INTRAVENOUS
  Filled 2011-10-09: qty 1

## 2011-10-09 NOTE — Progress Notes (Signed)
INFECTIOUS DISEASE PROGRESS NOTE  ID: Glen Henry is a 78 y.o. male with  Principal Problem:  *Community acquired pneumonia Active Problems:  Leukocytosis  H/O prostate cancer  H/O melanoma excision  Encephalopathy  Hyponatremia  Abnormal CT scan, chest  Diarrhea  Subjective: 1 Bm today, not loose.   Abtx:  Anti-infectives     Start     Dose/Rate Route Frequency Ordered Stop   10/09/11 1400   vancomycin (VANCOCIN) 50 mg/mL oral solution 125 mg        125 mg Oral 4 times daily 10/09/11 1242 10/23/11 1359   10/08/11 1900   levofloxacin (LEVAQUIN) tablet 500 mg        500 mg Oral Daily 10/08/11 1808 10/15/11 0959   10/08/11 1400   ceFEPIme (MAXIPIME) 1 g in dextrose 5 % 50 mL IVPB  Status:  Discontinued        1 g 100 mL/hr over 30 Minutes Intravenous 3 times per day 10/08/11 0841 10/08/11 1808   10/07/11 1000   ceFEPIme (MAXIPIME) 1 g in dextrose 5 % 50 mL IVPB  Status:  Discontinued        1 g 100 mL/hr over 30 Minutes Intravenous Every 12 hours 10/07/11 0149 10/08/11 0841   10/07/11 0230   vancomycin (VANCOCIN) IVPB 1000 mg/200 mL premix  Status:  Discontinued        1,000 mg 200 mL/hr over 60 Minutes Intravenous Every 12 hours 10/07/11 0209 10/08/11 1808   10/06/11 2345   cefTRIAXone (ROCEPHIN) 1 g in dextrose 5 % 50 mL IVPB        1 g 100 mL/hr over 30 Minutes Intravenous  Once 10/06/11 2340 10/07/11 0123   10/06/11 2345   azithromycin (ZITHROMAX) 500 mg in dextrose 5 % 250 mL IVPB        500 mg 250 mL/hr over 60 Minutes Intravenous  Once 10/06/11 2340 10/07/11 0350          Medications:  Scheduled:   . antiseptic oral rinse  15 mL Mouth Rinse BID  . Chlorhexidine Gluconate Cloth  6 each Topical Q0600  . dextromethorphan-guaiFENesin  1 tablet Oral BID  . enoxaparin  40 mg Subcutaneous Q24H  . levofloxacin  500 mg Oral Daily  . mupirocin ointment  1 application Nasal BID  . vancomycin  125 mg Oral QID  . DISCONTD: ceFEPime (MAXIPIME) IV  1 g  Intravenous Q8H  . DISCONTD: vancomycin  125 mg Oral QID  . DISCONTD: vancomycin  1,000 mg Intravenous Q12H    Objective: Vital signs in last 24 hours: Temp:  [98.3 F (36.8 C)-100.8 F (38.2 C)] 98.7 F (37.1 C) (04/03 1402) Pulse Rate:  [73-82] 80  (04/03 1402) Resp:  [18-24] 18  (04/03 1402) BP: (130-168)/(67-82) 157/80 mmHg (04/03 1402) SpO2:  [93 %-96 %] 96 % (04/03 1402) Weight:  [77.5 kg (170 lb 13.7 oz)] 77.5 kg (170 lb 13.7 oz) (04/03 0500)   Resp: clear to auscultation bilaterally Cardio: regular rate and rhythm GI: normal findings: bowel sounds normal and soft, non-tender  Lab Results  Basename 10/09/11 0435 10/08/11 0420  WBC 20.3* 29.2*  HGB 13.1 12.6*  HCT 38.9* 37.5*  NA 131* 129*  K 4.1 4.0  CL 96 95*  CO2 28 24  BUN 12 15  CREATININE 0.92 0.88  GLU -- --   Liver Panel No results found for this basename: PROT:2,ALBUMIN:2,AST:2,ALT:2,ALKPHOS:2,BILITOT:2,BILIDIR:2,IBILI:2 in the last 72 hours Sedimentation Rate  Basename 10/07/11 1718  ESRSEDRATE  45*   C-Reactive Protein No results found for this basename: CRP:2 in the last 72 hours  Microbiology: Recent Results (from the past 240 hour(s))  CULTURE, BLOOD (ROUTINE X 2)     Status: Normal (Preliminary result)   Collection Time   10/06/11  9:00 PM      Component Value Range Status Comment   Specimen Description BLOOD L HAND   Final    Special Requests BOTTLES DRAWN AEROBIC AND ANAEROBIC 4CC   Final    Culture  Setup Time [id number]   Final    Culture     Final    Value:        BLOOD CULTURE RECEIVED NO GROWTH TO DATE CULTURE WILL BE HELD FOR 5 DAYS BEFORE ISSUING A FINAL NEGATIVE REPORT   Report Status PENDING   Incomplete   CULTURE, BLOOD (ROUTINE X 2)     Status: Normal (Preliminary result)   Collection Time   10/07/11 12:55 AM      Component Value Range Status Comment   Specimen Description BLOOD LEFT ANTECUBITAL   Final    Special Requests     Final    Value: BOTTLES DRAWN AEROBIC AND  ANAEROBIC 5CC AEROBIC/3CC ANAEROBIC   Culture  Setup Time [id number]   Final    Culture     Final    Value:        BLOOD CULTURE RECEIVED NO GROWTH TO DATE CULTURE WILL BE HELD FOR 5 DAYS BEFORE ISSUING A FINAL NEGATIVE REPORT   Report Status PENDING   Incomplete   CULTURE, RESPIRATORY     Status: Normal (Preliminary result)   Collection Time   10/07/11  2:00 AM      Component Value Range Status Comment   Specimen Description SPUTUM   Final    Special Requests NONE   Final    Gram Stain     Final    Value: FEW WBC PRESENT, PREDOMINANTLY PMN     NO SQUAMOUS EPITHELIAL CELLS SEEN     FEW GRAM POSITIVE COCCI     IN PAIRS IN CLUSTERS RARE GRAM POSITIVE RODS     RARE GRAM NEGATIVE RODS   Culture     Final    Value: MODERATE STAPHYLOCOCCUS AUREUS     Note: RIF   Report Status PENDING   Incomplete   MRSA PCR SCREENING     Status: Abnormal   Collection Time   10/07/11  2:09 AM      Component Value Range Status Comment   MRSA by PCR POSITIVE (*) NEGATIVE  Final   CULTURE, SPUTUM-ASSESSMENT     Status: Normal   Collection Time   10/07/11  2:09 AM      Component Value Range Status Comment   Specimen Description SPUTUM   Final    Special Requests NONE   Final    Sputum evaluation     Final    Value: THIS SPECIMEN IS ACCEPTABLE. RESPIRATORY CULTURE REPORT TO FOLLOW.   Report Status 10/07/2011 FINAL   Final   CLOSTRIDIUM DIFFICILE BY PCR     Status: Abnormal   Collection Time   10/09/11  9:59 AM      Component Value Range Status Comment   C difficile by pcr POSITIVE (*) NEGATIVE  Final     Studies/Results: No results found.   Assessment/Plan: Lung Nodules Leukocytosis C diff Day 1 Po vanco Day 2 Avelox WBC better (before po vanco dose) Would- continue PO vanco, doubt he  has MRSA pneumonia as his g/s showed multiple morphotypes (GNR, GPR, GPC).  Await AFB stain (in Am) Continue po avelox for now.    Appreciate Pulm eval.  Explained in detail to wife and pt.   Johny Sax Infectious Diseases [phone] 10/09/2011, 3:35 PM

## 2011-10-09 NOTE — Progress Notes (Signed)
CRITICAL VALUE ALERT  Critical value received: C Diff Positive  Date of notification:  10/09/11  Time of notification:1235pm  Critical value read back:yes  Nurse who received alert: Orlene Och RN  MD notified (1st page):Dr. Betti Cruz - on floor. Verbal notification.  Time of first page:  1240pm  MD notified (2nd page):  Time of second page:  Responding MD:  0  Time MD responded:  0

## 2011-10-09 NOTE — Progress Notes (Addendum)
Subjective: Patient had low-grade fever this morning.  No other specific complaints or concerns.  Ambulating in the room.  Complained of upper back pain which has improved.  Objective: Vital signs in last 24 hours: Filed Vitals:   10/08/11 1313 10/08/11 2207 10/09/11 0500 10/09/11 0624  BP: 144/79 168/82 130/67   Pulse: 70 82 73   Temp: 97.7 F (36.5 C) 98.8 F (37.1 C) 100.8 F (38.2 C) 98.3 F (36.8 C)  TempSrc: Oral Oral Oral   Resp: 20 18 24    Height:      Weight:   77.5 kg (170 lb 13.7 oz)   SpO2: 92% 94% 93%    Weight change:   Intake/Output Summary (Last 24 hours) at 10/09/11 1221 Last data filed at 10/09/11 1610  Gross per 24 hour  Intake   4623 ml  Output   1100 ml  Net   3523 ml    Physical Exam: General: Awake, Oriented, No acute distress. HEENT: EOMI. Neck: Supple CV: S1 and S2 Lungs: Scattered crackles at bases bilaterally. Abdomen: Soft, Nontender, Nondistended, +bowel sounds. Ext: Good pulses. Trace edema. Skin: Surgical scar noted over the back.  Lab Results:  Basename 10/09/11 0435 10/08/11 0420  NA 131* 129*  K 4.1 4.0  CL 96 95*  CO2 28 24  GLUCOSE 130* 131*  BUN 12 15  CREATININE 0.92 0.88  CALCIUM 9.4 9.2  MG -- --  PHOS -- --   No results found for this basename: AST:2,ALT:2,ALKPHOS:2,BILITOT:2,PROT:2,ALBUMIN:2 in the last 72 hours No results found for this basename: LIPASE:2,AMYLASE:2 in the last 72 hours  Basename 10/09/11 0435 10/08/11 0420  WBC 20.3* 29.2*  NEUTROABS 15.8* 23.4*  HGB 13.1 12.6*  HCT 38.9* 37.5*  MCV 87.8 87.4  PLT 484* 491*   No results found for this basename: CKTOTAL:3,CKMB:3,CKMBINDEX:3,TROPONINI:3 in the last 72 hours No components found with this basename: POCBNP:3 No results found for this basename: DDIMER:2 in the last 72 hours No results found for this basename: HGBA1C:2 in the last 72 hours No results found for this basename: CHOL:2,HDL:2,LDLCALC:2,TRIG:2,CHOLHDL:2,LDLDIRECT:2 in the last 72  hours  Basename 10/07/11 0405  TSH 0.547  T4TOTAL --  T3FREE --  THYROIDAB --   No results found for this basename: VITAMINB12:2,FOLATE:2,FERRITIN:2,TIBC:2,IRON:2,RETICCTPCT:2 in the last 72 hours  Micro Results: Recent Results (from the past 240 hour(s))  CULTURE, BLOOD (ROUTINE X 2)     Status: Normal (Preliminary result)   Collection Time   10/06/11  9:00 PM      Component Value Range Status Comment   Specimen Description BLOOD L HAND   Final    Special Requests BOTTLES DRAWN AEROBIC AND ANAEROBIC 4CC   Final    Culture  Setup Time [id number]   Final    Culture     Final    Value:        BLOOD CULTURE RECEIVED NO GROWTH TO DATE CULTURE WILL BE HELD FOR 5 DAYS BEFORE ISSUING A FINAL NEGATIVE REPORT   Report Status PENDING   Incomplete   CULTURE, BLOOD (ROUTINE X 2)     Status: Normal (Preliminary result)   Collection Time   10/07/11 12:55 AM      Component Value Range Status Comment   Specimen Description BLOOD LEFT ANTECUBITAL   Final    Special Requests     Final    Value: BOTTLES DRAWN AEROBIC AND ANAEROBIC 5CC AEROBIC/3CC ANAEROBIC   Culture  Setup Time [id number]   Final    Culture  Final    Value:        BLOOD CULTURE RECEIVED NO GROWTH TO DATE CULTURE WILL BE HELD FOR 5 DAYS BEFORE ISSUING A FINAL NEGATIVE REPORT   Report Status PENDING   Incomplete   CULTURE, RESPIRATORY     Status: Normal (Preliminary result)   Collection Time   10/07/11  2:00 AM      Component Value Range Status Comment   Specimen Description SPUTUM   Final    Special Requests NONE   Final    Gram Stain     Final    Value: FEW WBC PRESENT, PREDOMINANTLY PMN     NO SQUAMOUS EPITHELIAL CELLS SEEN     FEW GRAM POSITIVE COCCI     IN PAIRS IN CLUSTERS RARE GRAM POSITIVE RODS     RARE GRAM NEGATIVE RODS   Culture     Final    Value: MODERATE STAPHYLOCOCCUS AUREUS     Note: RIF   Report Status PENDING   Incomplete   MRSA PCR SCREENING     Status: Abnormal   Collection Time   10/07/11  2:09 AM       Component Value Range Status Comment   MRSA by PCR POSITIVE (*) NEGATIVE  Final   CULTURE, SPUTUM-ASSESSMENT     Status: Normal   Collection Time   10/07/11  2:09 AM      Component Value Range Status Comment   Specimen Description SPUTUM   Final    Special Requests NONE   Final    Sputum evaluation     Final    Value: THIS SPECIMEN IS ACCEPTABLE. RESPIRATORY CULTURE REPORT TO FOLLOW.   Report Status 10/07/2011 FINAL   Final     Studies/Results: No results found.  Medications: I have reviewed the patient's current medications. Scheduled Meds:   . antiseptic oral rinse  15 mL Mouth Rinse BID  . Chlorhexidine Gluconate Cloth  6 each Topical Q0600  . dextromethorphan-guaiFENesin  1 tablet Oral BID  . enoxaparin  40 mg Subcutaneous Q24H  . levofloxacin  500 mg Oral Daily  . mupirocin ointment  1 application Nasal BID  . DISCONTD: ceFEPime (MAXIPIME) IV  1 g Intravenous Q8H  . DISCONTD: vancomycin  1,000 mg Intravenous Q12H   Continuous Infusions:   . sodium chloride 125 mL/hr at 10/09/11 0624   PRN Meds:.acetaminophen, acetaminophen, albuterol, alum & mag hydroxide-simeth, bisacodyl, HYDROcodone-acetaminophen, ipratropium, ondansetron (ZOFRAN) IV, ondansetron, sodium chloride, zolpidem  Assessment/Plan: Probable community-acquired pneumonia/upper respiratory symptoms  Patient has had low-grade fever this morning with improving leukocytosis.  AFB culture smear pending. Urine legionella antigen negative. Blood cultures show no growth to date. Urine strep pneumococcus antigen negative.  Respiratory culture showed moderate staph aureus.  Influenza panel negative.  CT scan with abnormal findings of pulmonary nodules and lymphadenopathy.  Initially the patient was on empiric IV vancomycin and cefepime, which has been transitioned to levofloxacin on 10/08/2011.  Antibiotics since 10/07/2011.  Continue Mucinex, nebs PRN. Pulmonary input appriciated.  Pulmonary nodules/abnormal chest  CT Dr. Sherene Sires with pulmonary evaluated the patient and will need outpatient workup including PET and possible biopsy. ANCA pending.  Tumor markers such as AFP, CEA 19-9, CEA and PSA normal.  Leukocytosis  Unclear etiology. WBC improving. Blood cx show no growth to date.  Workup and antibiotics as indicated above.  ID following.  C. difficile PCR sent and pending.  ANA negative.  Rheumatoid factor less than 10.  Encephalopathy  Likely secondary to number 1.  Improved.  HTN  Stable. Follow   Hx prostateCA/Melanoma  In remission    Diarrhea  Improved.  C. difficile PCR pending. Patient states had diarrhea in Jan and Feb.   Hyponatremia  Likely secondary to dehydration.  Stable.  Continue to follow.    Prophylaxis  Lovenox   Disposition  Pending.    LOS: 3 days  Makenize Messman A, MD 10/09/2011, 12:21 PM  Addendum: Patient is C. difficile positive started the patient on oral vancomycin x14 days per c.diff protocol.  [place] Hanlon A, MD 10/09/2011, 2:35 PM

## 2011-10-09 NOTE — Progress Notes (Signed)
Name: Glen Henry MRN: [id number] DOB: [birth year]    LOS: 3  [facility]  NOTE  Brief patient profile:  67 yowm with h/o prostate ca and melanoma admitted 3/31 with sev weeks cough with multiple pulmonary nodules on cxr/CT and ccm asked to eval 4/1.     Micro/sepsis markers: MRSA screen 4/1 > neg Sputum 4/1 >>>  Antibiotics: Roc/Zmax ER only Maxepime (?pna)4/1>> Vanc (?pna)3/31>>  Tests / Events: CT chest 3/31 > Multiple poorly defined nodules scattered throughout the right  lung. Mild right-sided mediastinal lymphadenopathy  Subjective/ Overnight events:       Vital Signs: BP 157/80  Pulse 80  Temp(Src) 98.7 F (37.1 C) (Oral)  Resp 18  Ht 5\' 9"  (1.753 m)  Wt 170 lb 13.7 oz (77.5 kg)  BMI 25.23 kg/m2  SpO2 96%  On 2lpm  Intake/Output Summary (Last 24 hours) at 10/09/11 1450 Last data filed at 10/09/11 1400  Gross per 24 hour  Intake   6101 ml  Output   1725 ml  Net   4376 ml     Physical Examination: General: elderly frail wm nad Neuro:  Nonfocal, responses slowed HEENT:  PERRL, pink conjunctivae, moist membranes Neck:  Supple, no JVD   Cardiovascular:  RRR, no M/R/G Lungs:  Bilateral diminished air entry, no W/R/R Abdomen:  Soft, nontender, nondistended, bowel sounds present Musculoskeletal:  Moves all extremities, no pedal edema Skin:  No rash   Labs    Lab 10/09/11 0435 10/08/11 0420 10/07/11 0405  NA 131* 129* 130*  K 4.1 4.0 3.7  CL 96 95* 95*  CO2 28 24 24   BUN 12 15 14   CREATININE 0.92 0.88 0.87  GLUCOSE 130* 131* 226*    Lab 10/09/11 0435 10/08/11 0420 10/07/11 0405  HGB 13.1 12.6* 13.7  HCT 38.9* 37.5* 40.7  WBC 20.3* 29.2* 26.1*  PLT 484* 491* 532*       ASSESSMENT AND PLAN    Multiple pulmonary nodules ? etilogy Met ca, septic emboli, collagen vasc dz in ddx  - No need for further inpt w/u - outpt pet then bx the most accessible hot spot p you treat him for his uri as I don't think these nodules have anything to do  with his resp symptoms - Check baseline pct/ esr/ Anca levels   Discharge Orders    Future Appointments: Provider: Department: Dept Phone: Center:   10/23/2011 4:00 PM Nyoka Cowden, MD [facility] [phone] None       NEUROLOGIC A:  ? Mild tme vs underlying dementia? If not back to baseline consider mri brain to look for head mets as PET scan not good for cns    PCCM signing off please call if needed.    Danford Bad, NP 10/09/2011  2:50 PM Pager: [phone]  *Care during the described time interval was provided by me and/or other providers on the critical care team. I have reviewed this patient's available data, including medical history, events of note, physical examination and test results as part of my evaluation.  Sandrea Hughs, MD Pulmonary and Critical Care Medicine [facility] Cell [phone]

## 2011-10-10 DIAGNOSIS — A0472 Enterocolitis due to Clostridium difficile, not specified as recurrent: Secondary | ICD-10-CM | POA: Diagnosis present

## 2011-10-10 LAB — CBC
MCV: 85.5 fL (ref 78.0–100.0)
Platelets: 534 10*3/uL — ABNORMAL HIGH (ref 150–400)
RBC: 4.34 MIL/uL (ref 4.22–5.81)
RDW: 13.9 % (ref 11.5–15.5)
WBC: 17 10*3/uL — ABNORMAL HIGH (ref 4.0–10.5)

## 2011-10-10 LAB — BASIC METABOLIC PANEL
Calcium: 9.3 mg/dL (ref 8.4–10.5)
Chloride: 98 mEq/L (ref 96–112)
Creatinine, Ser: 0.76 mg/dL (ref 0.50–1.35)
GFR calc Af Amer: >90 mL/min (ref >90)
Sodium: 133 mEq/L — ABNORMAL LOW (ref 135–145)

## 2011-10-10 LAB — CULTURE, RESPIRATORY W GRAM STAIN

## 2011-10-10 MED ORDER — LEVOFLOXACIN 500 MG PO TABS: 500.0000 mg | ORAL_TABLET | Freq: Every day | ORAL | Status: AC

## 2011-10-10 MED ORDER — DM-GUAIFENESIN ER 30-600 MG PO TB12: 1.0000 | ORAL_TABLET | Freq: Two times a day (BID) | ORAL | Status: AC

## 2011-10-10 MED ORDER — LISINOPRIL 10 MG PO TABS: 10.0000 mg | ORAL_TABLET | Freq: Every day | ORAL | Status: DC

## 2011-10-10 MED ORDER — VANCOMYCIN 50 MG/ML ORAL SOLUTION: 125.0000 mg | Freq: Four times a day (QID) | ORAL | Status: DC

## 2011-10-10 MED ORDER — LISINOPRIL 10 MG PO TABS
10.0000 mg | ORAL_TABLET | Freq: Every day | ORAL | Status: DC
  Filled 2011-10-10: qty 1

## 2011-10-10 NOTE — Progress Notes (Signed)
DC to home. To car by w.c. No change from AM assessment.Glen Henry  

## 2011-10-10 NOTE — Discharge Summary (Addendum)
Discharge Summary  BOBAK OGUINN MR#: [id number]  DOB:[birth year]  Date of Admission: 10/06/2011 Date of Discharge: 10/10/2011  Patient's PCP: Lupita Raider, MD, MD  Attending Physician:Faiz Weber A  Consults: Dr. Sherene Sires - pulmonary Dr. Ninetta Lights - ID  Discharge Diagnoses: Principal Problem:  *Community acquired pneumonia Active Problems:  Leukocytosis  H/O prostate cancer  H/O melanoma excision  Encephalopathy  Hyponatremia  Abnormal CT scan, chest  Diarrhea  C. difficile colitis Pulmonary nodules.  Brief Admitting History and Physical [name] who presented on 10/07/2011 with shortness of breath.  Discharge Medications Medication List  As of 10/10/2011 10:02 AM   TAKE these medications         aspirin 325 MG tablet   Take 325 mg by mouth 2 (two) times daily.      calcium-vitamin D 500-200 MG-UNIT per tablet   Commonly known as: OSCAL WITH D   Take 1 tablet by mouth 2 (two) times daily.      dextromethorphan-guaiFENesin 30-600 MG per 12 hr tablet   Commonly known as: MUCINEX DM   Take 1 tablet by mouth 2 (two) times daily.      levofloxacin 500 MG tablet   Commonly known as: LEVAQUIN   Take 1 tablet (500 mg total) by mouth daily.      lisinopril 10 MG tablet   Commonly known as: PRINIVIL,ZESTRIL   Take 1 tablet (10 mg total) by mouth daily.      vancomycin 50 mg/mL oral solution   Commonly known as: VANCOCIN   Take 2.5 mLs (125 mg total) by mouth 4 (four) times daily. For total of 13 days from 10/10/2011. May be given at this tablets/capsules if patient prefers.            Hospital Course: Probable community-acquired pneumonia/upper respiratory symptoms  Patient initially had intermittent episodes of fever, afebrile for the last 24 hours prior to discharge.  AFB culture smear pending. Urine legionella antigen negative. Blood cultures show no growth to date. Urine strep pneumococcus antigen negative.  Respiratory culture showed moderate  staph aureus.  Influenza panel negative.  CT scan with abnormal findings of pulmonary nodules and lymphadenopathy.  Initially the patient was on empiric IV vancomycin and cefepime, which has been transitioned to levofloxacin on 10/08/2011.  Antibiotics since 10/07/2011, discussed with ID will define a 5 day course.  Continue Mucinex, nebs PRN. Pulmonary input appriciated. Patient per ID indicated that she did not need to be in respiratory isolation discharge.  Pulmonary nodules/abnormal chest CT Dr. Sherene Sires with pulmonary evaluated the patient and will need outpatient workup including PET and possible biopsy. ANCA pending.  Tumor markers such as AFP, CEA 19-9, CEA and PSA normal.  C. difficile colitis C. difficile was positive on 10/09/2011. Started on 14 day course of oral vancomycin.  Leukocytosis  Likely due to C. difficile colitis. WBC improving. Blood cx show no growth to date.  Workup and antibiotics as indicated above.  ID following. ANA negative.  Rheumatoid factor less than 10.  Encephalopathy  Likely secondary to community-acquired pneumonia.  Improved.  HTN  Stable. Patient reported that he has been taking lisinopril 10 mg daily which is not listed as his home medication. Continue this medication after discharge.  Hx prostateCA/Melanoma  In remission    Diarrhea  Improved.  C. difficile positive as indicated above.   Hyponatremia  Likely secondary to dehydration.  Improved.  Continue to follow.    Day of Discharge BP 146/78  Pulse 81  Temp(Src) 98.7  F (37.1 C) (Oral)  Resp 18  Ht 5\' 9"  (1.753 m)  Wt 77.5 kg (170 lb 13.7 oz)  BMI 25.23 kg/m2  SpO2 99%  Results for orders placed during the hospital encounter of 10/06/11 (from the past 48 hour(s))  STREP PNEUMONIAE URINARY ANTIGEN     Status: Normal   Collection Time   10/08/11 10:37 AM      Component Value Range Comment   Strep Pneumo Urinary Antigen NEGATIVE  NEGATIVE    VANCOMYCIN, TROUGH     Status: Normal    Collection Time   10/08/11  2:04 PM      Component Value Range Comment   Vancomycin Tr 16.1  10.0 - 20.0 (ug/mL)   OSMOLALITY     Status: Abnormal   Collection Time   10/08/11  2:04 PM      Component Value Range Comment   Osmolality 273 (*) 275 - 300 (mOsm/kg)   URINALYSIS, ROUTINE W REFLEX MICROSCOPIC     Status: Normal   Collection Time   10/08/11  2:07 PM      Component Value Range Comment   Color, Urine YELLOW  YELLOW     APPearance CLEAR  CLEAR     Specific Gravity, Urine 1.016  1.005 - 1.030     pH 6.5  5.0 - 8.0     Glucose, UA NEGATIVE  NEGATIVE (mg/dL)    Hgb urine dipstick NEGATIVE  NEGATIVE     Bilirubin Urine NEGATIVE  NEGATIVE     Ketones, ur NEGATIVE  NEGATIVE (mg/dL)    Protein, ur NEGATIVE  NEGATIVE (mg/dL)    Urobilinogen, UA 0.2  0.0 - 1.0 (mg/dL)    Nitrite NEGATIVE  NEGATIVE     Leukocytes, UA NEGATIVE  NEGATIVE  MICROSCOPIC NOT DONE ON URINES WITH NEGATIVE PROTEIN, BLOOD, LEUKOCYTES, NITRITE, OR GLUCOSE <1000 mg/dL.  URINE CULTURE     Status: Normal   Collection Time   10/08/11  2:07 PM      Component Value Range Comment   Specimen Description URINE, CLEAN CATCH      Special Requests NONE      Culture  Setup Time [id number]      Colony Count NO GROWTH      Culture NO GROWTH      Report Status 10/09/2011 FINAL     SODIUM, URINE, RANDOM     Status: Normal   Collection Time   10/08/11  2:07 PM      Component Value Range Comment   Sodium, Ur 66     CREATININE, URINE, RANDOM     Status: Normal   Collection Time   10/08/11  2:07 PM      Component Value Range Comment   Creatinine, Urine 53.9     OSMOLALITY, URINE     Status: Abnormal   Collection Time   10/08/11  2:07 PM      Component Value Range Comment   Osmolality, Ur 364 (*) 390 - 1090 (mOsm/kg)   ANA     Status: Normal   Collection Time   10/08/11  7:12 PM      Component Value Range Comment   ANA NEGATIVE  NEGATIVE    RHEUMATOID FACTOR     Status: Normal   Collection Time   10/08/11  7:12 PM       Component Value Range Comment   Rheumatoid Factor <10  <=14 (IU/mL)   BASIC METABOLIC PANEL     Status: Abnormal   Collection  Time   10/09/11  4:35 AM      Component Value Range Comment   Sodium 131 (*) 135 - 145 (mEq/L)    Potassium 4.1  3.5 - 5.1 (mEq/L)    Chloride 96  96 - 112 (mEq/L)    CO2 28  19 - 32 (mEq/L)    Glucose, Bld 130 (*) 70 - 99 (mg/dL)    BUN 12  6 - 23 (mg/dL)    Creatinine, Ser 1.61  0.50 - 1.35 (mg/dL)    Calcium 9.4  8.4 - 10.5 (mg/dL)    GFR calc non Af Amer 79 (*) >90 (mL/min)    GFR calc Af Amer >90  >90 (mL/min)   CBC     Status: Abnormal   Collection Time   10/09/11  4:35 AM      Component Value Range Comment   WBC 20.3 (*) 4.0 - 10.5 (K/uL)    RBC 4.43  4.22 - 5.81 (MIL/uL)    Hemoglobin 13.1  13.0 - 17.0 (g/dL)    HCT 09.6 (*) 04.5 - 52.0 (%)    MCV 87.8  78.0 - 100.0 (fL)    MCH 29.6  26.0 - 34.0 (pg)    MCHC 33.7  30.0 - 36.0 (g/dL)    RDW 40.9  81.1 - 91.4 (%)    Platelets 484 (*) 150 - 400 (K/uL)   DIFFERENTIAL     Status: Abnormal   Collection Time   10/09/11  4:35 AM      Component Value Range Comment   Neutrophils Relative 78 (*) 43 - 77 (%)    Neutro Abs 15.8 (*) 1.7 - 7.7 (K/uL)    Lymphocytes Relative 12  12 - 46 (%)    Lymphs Abs 2.4  0.7 - 4.0 (K/uL)    Monocytes Relative 9  3 - 12 (%)    Monocytes Absolute 1.9 (*) 0.1 - 1.0 (K/uL)    Eosinophils Relative 0  0 - 5 (%)    Eosinophils Absolute 0.1  0.0 - 0.7 (K/uL)    Basophils Relative 0  0 - 1 (%)    Basophils Absolute 0.1  0.0 - 0.1 (K/uL)   CLOSTRIDIUM DIFFICILE BY PCR     Status: Abnormal   Collection Time   10/09/11  9:59 AM      Component Value Range Comment   C difficile by pcr POSITIVE (*) NEGATIVE    CBC     Status: Abnormal   Collection Time   10/10/11  4:09 AM      Component Value Range Comment   WBC 17.0 (*) 4.0 - 10.5 (K/uL)    RBC 4.34  4.22 - 5.81 (MIL/uL)    Hemoglobin 12.5 (*) 13.0 - 17.0 (g/dL)    HCT 78.2 (*) 95.6 - 52.0 (%)    MCV 85.5  78.0 - 100.0 (fL)     MCH 28.8  26.0 - 34.0 (pg)    MCHC 33.7  30.0 - 36.0 (g/dL)    RDW 21.3  08.6 - 57.8 (%)    Platelets 534 (*) 150 - 400 (K/uL)   BASIC METABOLIC PANEL     Status: Abnormal   Collection Time   10/10/11  4:09 AM      Component Value Range Comment   Sodium 133 (*) 135 - 145 (mEq/L)    Potassium 3.7  3.5 - 5.1 (mEq/L)    Chloride 98  96 - 112 (mEq/L)    CO2 24  19 -  32 (mEq/L)    Glucose, Bld 116 (*) 70 - 99 (mg/dL)    BUN 12  6 - 23 (mg/dL)    Creatinine, Ser 8.41  0.50 - 1.35 (mg/dL)    Calcium 9.3  8.4 - 10.5 (mg/dL)    GFR calc non Af Amer 85 (*) >90 (mL/min)    GFR calc Af Amer >90  >90 (mL/min)     Dg Chest 2 View  10/06/2011  *RADIOLOGY REPORT*  Clinical Data: Cough.  CHEST - 2 VIEW  Comparison: 07/24/2010.  Findings: The cardiac silhouette, mediastinal and hilar contours are within normal limits and stable.  Bilateral pulmonary nodules are suspected.  Recommend further evaluation with chest CT.  No focal infiltrates, edema or effusions.  The bony thorax is intact.  IMPRESSION: Suspect bilateral pulmonary nodules worrisome for metastatic disease.  Recommend chest CT with contrast for further evaluation.  Original Report Authenticated By: P. Loralie Champagne, M.D.   Ct Chest W Contrast  10/06/2011  *RADIOLOGY REPORT*  Clinical Data: Chest congestion, fever, and wheezing.  Suspicion of pulmonary nodules on plain radiograph. Former smoker.  CT CHEST WITH CONTRAST  Technique:  Multidetector CT imaging of the chest was performed following the standard protocol during bolus administration of intravenous contrast.  Contrast: OMNIPAQUE IOHEXOL 300 MG/ML IJ SOLN  Comparison: Chest 10/06/2011  Findings: Multiple old somewhat poorly defined irregularly marginated ground-glass nodules scattered throughout the right lung.  Largest of these measures about 1.3 cm diameter.  Tiny nodules on the left, greatest size measuring 5 mm.  On the right, they are somewhat more confluent areas in the right middle  lung which have the tree in bud pattern.  Changes could represent pulmonary metastasis or atypical nodular infectious process such as TB or fungal infection. Mildly enlarged right pretracheal, right hilar, and subcarinal lymph nodes measuring up to 13 mm diameter. These could represent metastatic or reactive nodes.  Surgical clips in the left axilla.  No additional lymphadenopathy in the chest. Normal caliber thoracic aorta without aneurysm.  Normal heart size. Small esophageal hiatal hernia.  The esophagus is mostly decompressed without wall thickening.  No pleural effusion.  Mild dependent atelectasis in the lung bases.  Degenerative changes in the thoracic spine.  No destructive bone lesions appreciated.  IMPRESSION: Multiple poorly defined nodules scattered throughout the right lung. Mild right-sided mediastinal lymphadenopathy.  Differential diagnosis includes metastasis, lymphoma, multi focal primary carcinoma, or atypical infectious process.  Original Report Authenticated By: Marlon Pel, M.D.   Disposition: Home with followup as indicated below.  Diet: Heart healthy diet.  Activity: Resume as tolerated.   Follow-up Appts: Discharge Orders    Future Appointments: Provider: Department: Dept Phone: Center:   10/23/2011 4:00 PM Nyoka Cowden, MD [facility] [phone] None     Future Orders Please Complete By Expires   Diet - low sodium heart healthy      Increase activity slowly      Discharge instructions      Comments:   Followup with Lupita Raider, MD (PCP) in 1 week. Followup with Dr. Sherene Sires as previously scheduled.      TESTS THAT NEED FOLLOW-UP AFB smear, ANCA pending, Chlamydia antibodies pending, mycoplasma antibody pending.  Time spent on discharge, talking to the patient, and coordinating care: 35 mins.   Signed: Cristal Ford, MD 10/10/2011, 10:02 AM   Addendum: Preliminary AFB smear no acid fast bacilli seen.  Myishia Kasik A, MD 10/10/2011, 4:33  PM  Addendum: Patient's wife called [place] indicating  that the patient had a co-pay of $400 for oral vancomycin in tablet form.  She was instructed to contact [facility] to see if liquid vancomycin orally can be provided more cheaply.  If they cannot financially afford the vancomycin, I have called [facility] which is patient's pharmacy for metronidazole 500 mg 3 times a day.  Patient's wife was instructed to take either oral vancomycin or metronidazole not both.  She was also instructed that if she has any questions or concerns to call her primary care physician for further management.  Cassidie Veiga A, MD 10/11/2011, 11:26 AM

## 2011-10-10 NOTE — Progress Notes (Addendum)
INFECTIOUS DISEASE PROGRESS NOTE  ID: Glen Henry is a 78 y.o. male with   Principal Problem:  *Community acquired pneumonia Active Problems:  Leukocytosis  H/O prostate cancer  H/O melanoma excision  Encephalopathy  Hyponatremia  Abnormal CT scan, chest  Diarrhea  Subjective: Occas cough. No diarrhea.   Abtx:  Anti-infectives     Start     Dose/Rate Route Frequency Ordered Stop   10/09/11 1800   vancomycin (VANCOCIN) 50 mg/mL oral solution 125 mg        125 mg Oral 4 times daily 10/09/11 1540 10/23/11 1759   10/09/11 1400   vancomycin (VANCOCIN) 50 mg/mL oral solution 125 mg  Status:  Discontinued        125 mg Oral 4 times daily 10/09/11 1242 10/09/11 1540   10/08/11 1900   levofloxacin (LEVAQUIN) tablet 500 mg        500 mg Oral Daily 10/08/11 1808 10/15/11 0959   10/08/11 1400   ceFEPIme (MAXIPIME) 1 g in dextrose 5 % 50 mL IVPB  Status:  Discontinued        1 g 100 mL/hr over 30 Minutes Intravenous 3 times per day 10/08/11 0841 10/08/11 1808   10/07/11 1000   ceFEPIme (MAXIPIME) 1 g in dextrose 5 % 50 mL IVPB  Status:  Discontinued        1 g 100 mL/hr over 30 Minutes Intravenous Every 12 hours 10/07/11 0149 10/08/11 0841   10/07/11 0230   vancomycin (VANCOCIN) IVPB 1000 mg/200 mL premix  Status:  Discontinued        1,000 mg 200 mL/hr over 60 Minutes Intravenous Every 12 hours 10/07/11 0209 10/08/11 1808   10/06/11 2345   cefTRIAXone (ROCEPHIN) 1 g in dextrose 5 % 50 mL IVPB        1 g 100 mL/hr over 30 Minutes Intravenous  Once 10/06/11 2340 10/07/11 0123   10/06/11 2345   azithromycin (ZITHROMAX) 500 mg in dextrose 5 % 250 mL IVPB        500 mg 250 mL/hr over 60 Minutes Intravenous  Once 10/06/11 2340 10/07/11 0350          Medications:  Scheduled:   . antiseptic oral rinse  15 mL Mouth Rinse BID  . Chlorhexidine Gluconate Cloth  6 each Topical Q0600  . dextromethorphan-guaiFENesin  1 tablet Oral BID  . enoxaparin  40 mg Subcutaneous Q24H  .  levofloxacin  500 mg Oral Daily  . mupirocin ointment  1 application Nasal BID  . vancomycin  125 mg Oral QID  . DISCONTD: vancomycin  125 mg Oral QID    Objective: Vital signs in last 24 hours: Temp:  [98.3 F (36.8 C)-98.7 F (37.1 C)] 98.7 F (37.1 C) (04/04 0647) Pulse Rate:  [80-85] 81  (04/04 0647) Resp:  [18] 18  (04/03 2006) BP: (142-172)/(72-83) 146/78 mmHg (04/04 0647) SpO2:  [94 %-99 %] 99 % (04/04 0647)   General appearance: alert, cooperative and no distress Resp: diminished breath sounds bilaterally Cardio: regular rate and rhythm GI: normal findings: bowel sounds normal and soft, non-tender  Lab Results  Basename 10/10/11 0409 10/09/11 0435  WBC 17.0* 20.3*  HGB 12.5* 13.1  HCT 37.1* 38.9*  NA 133* 131*  K 3.7 4.1  CL 98 96  CO2 24 28  BUN 12 12  CREATININE 0.76 0.92  GLU -- --   Liver Panel No results found for this basename: PROT:2,ALBUMIN:2,AST:2,ALT:2,ALKPHOS:2,BILITOT:2,BILIDIR:2,IBILI:2 in the last 72 hours Sedimentation Rate  Basename 10/07/11 1718  ESRSEDRATE 45*   C-Reactive Protein No results found for this basename: CRP:2 in the last 72 hours  Microbiology: Recent Results (from the past 240 hour(s))  CULTURE, BLOOD (ROUTINE X 2)     Status: Normal (Preliminary result)   Collection Time   10/06/11  9:00 PM      Component Value Range Status Comment   Specimen Description BLOOD L HAND   Final    Special Requests BOTTLES DRAWN AEROBIC AND ANAEROBIC 4CC   Final    Culture  Setup Time [id number]   Final    Culture     Final    Value:        BLOOD CULTURE RECEIVED NO GROWTH TO DATE CULTURE WILL BE HELD FOR 5 DAYS BEFORE ISSUING A FINAL NEGATIVE REPORT   Report Status PENDING   Incomplete   CULTURE, BLOOD (ROUTINE X 2)     Status: Normal (Preliminary result)   Collection Time   10/07/11 12:55 AM      Component Value Range Status Comment   Specimen Description BLOOD LEFT ANTECUBITAL   Final    Special Requests     Final    Value:  BOTTLES DRAWN AEROBIC AND ANAEROBIC 5CC AEROBIC/3CC ANAEROBIC   Culture  Setup Time [id number]   Final    Culture     Final    Value:        BLOOD CULTURE RECEIVED NO GROWTH TO DATE CULTURE WILL BE HELD FOR 5 DAYS BEFORE ISSUING A FINAL NEGATIVE REPORT   Report Status PENDING   Incomplete   CULTURE, RESPIRATORY     Status: Normal (Preliminary result)   Collection Time   10/07/11  2:00 AM      Component Value Range Status Comment   Specimen Description SPUTUM   Final    Special Requests NONE   Final    Gram Stain     Final    Value: FEW WBC PRESENT, PREDOMINANTLY PMN     NO SQUAMOUS EPITHELIAL CELLS SEEN     FEW GRAM POSITIVE COCCI     IN PAIRS IN CLUSTERS RARE GRAM POSITIVE RODS     RARE GRAM NEGATIVE RODS   Culture     Final    Value: MODERATE STAPHYLOCOCCUS AUREUS     Note: RIF   Report Status PENDING   Incomplete   MRSA PCR SCREENING     Status: Abnormal   Collection Time   10/07/11  2:09 AM      Component Value Range Status Comment   MRSA by PCR POSITIVE (*) NEGATIVE  Final   CULTURE, SPUTUM-ASSESSMENT     Status: Normal   Collection Time   10/07/11  2:09 AM      Component Value Range Status Comment   Specimen Description SPUTUM   Final    Special Requests NONE   Final    Sputum evaluation     Final    Value: THIS SPECIMEN IS ACCEPTABLE. RESPIRATORY CULTURE REPORT TO FOLLOW.   Report Status 10/07/2011 FINAL   Final   URINE CULTURE     Status: Normal   Collection Time   10/08/11  2:07 PM      Component Value Range Status Comment   Specimen Description URINE, CLEAN CATCH   Final    Special Requests NONE   Final    Culture  Setup Time [id number]   Final    Colony Count NO GROWTH   Final  Culture NO GROWTH   Final    Report Status 10/09/2011 FINAL   Final   CLOSTRIDIUM DIFFICILE BY PCR     Status: Abnormal   Collection Time   10/09/11  9:59 AM      Component Value Range Status Comment   C difficile by pcr POSITIVE (*) NEGATIVE  Final     Studies/Results: No results  found.   Assessment/Plan: C diff Lung Nodules Leukocytosis Day 2 po vanco (of 14) Day 3 levaquin (of 5) Await his sputum AFB stain, out this afternoon.  Mycoplasma, Chlamydia Abs pending.  Suspect his increased WBC from C diff.  Not sure he needs further respiratory isolation.  Agree with d/c planning. Has pulm f/u.   Johny Sax Infectious Diseases [phone] 10/10/2011, 9:11 AM

## 2011-10-10 NOTE — Progress Notes (Signed)
Subjective: Patient had called this morning. Ambulating in the room. Wondering if he can be discharged.  Objective: Vital signs in last 24 hours: Filed Vitals:   10/09/11 1402 10/09/11 2006 10/09/11 2230 10/10/11 0647  BP: 157/80 172/83 142/72 146/78  Pulse: 80 85  81  Temp: 98.7 F (37.1 C) 98.3 F (36.8 C)  98.7 F (37.1 C)  TempSrc: Oral Oral  Oral  Resp: 18 18    Height:      Weight:      SpO2: 96% 94%  99%   Weight change:   Intake/Output Summary (Last 24 hours) at 10/10/11 0953 Last data filed at 10/10/11 1191  Gross per 24 hour  Intake 2186.73 ml  Output   1875 ml  Net 311.73 ml    Physical Exam: General: Awake, Oriented, No acute distress. HEENT: EOMI. Neck: Supple CV: S1 and S2 Lungs: Scattered crackles at bases bilaterally. Abdomen: Soft, Nontender, Nondistended, +bowel sounds. Ext: Good pulses. Trace edema. Skin: Surgical scar noted over the back.  Lab Results:  Basename 10/10/11 0409 10/09/11 0435  NA 133* 131*  K 3.7 4.1  CL 98 96  CO2 24 28  GLUCOSE 116* 130*  BUN 12 12  CREATININE 0.76 0.92  CALCIUM 9.3 9.4  MG -- --  PHOS -- --   No results found for this basename: AST:2,ALT:2,ALKPHOS:2,BILITOT:2,PROT:2,ALBUMIN:2 in the last 72 hours No results found for this basename: LIPASE:2,AMYLASE:2 in the last 72 hours  Basename 10/10/11 0409 10/09/11 0435 10/08/11 0420  WBC 17.0* 20.3* --  NEUTROABS -- 15.8* 23.4*  HGB 12.5* 13.1 --  HCT 37.1* 38.9* --  MCV 85.5 87.8 --  PLT 534* 484* --   No results found for this basename: CKTOTAL:3,CKMB:3,CKMBINDEX:3,TROPONINI:3 in the last 72 hours No components found with this basename: POCBNP:3 No results found for this basename: DDIMER:2 in the last 72 hours No results found for this basename: HGBA1C:2 in the last 72 hours No results found for this basename: CHOL:2,HDL:2,LDLCALC:2,TRIG:2,CHOLHDL:2,LDLDIRECT:2 in the last 72 hours No results found for this basename: TSH,T4TOTAL,FREET3,T3FREE,THYROIDAB  in the last 72 hours No results found for this basename: VITAMINB12:2,FOLATE:2,FERRITIN:2,TIBC:2,IRON:2,RETICCTPCT:2 in the last 72 hours  Micro Results: Recent Results (from the past 240 hour(s))  CULTURE, BLOOD (ROUTINE X 2)     Status: Normal (Preliminary result)   Collection Time   10/06/11  9:00 PM      Component Value Range Status Comment   Specimen Description BLOOD L HAND   Final    Special Requests BOTTLES DRAWN AEROBIC AND ANAEROBIC 4CC   Final    Culture  Setup Time [id number]   Final    Culture     Final    Value:        BLOOD CULTURE RECEIVED NO GROWTH TO DATE CULTURE WILL BE HELD FOR 5 DAYS BEFORE ISSUING A FINAL NEGATIVE REPORT   Report Status PENDING   Incomplete   CULTURE, BLOOD (ROUTINE X 2)     Status: Normal (Preliminary result)   Collection Time   10/07/11 12:55 AM      Component Value Range Status Comment   Specimen Description BLOOD LEFT ANTECUBITAL   Final    Special Requests     Final    Value: BOTTLES DRAWN AEROBIC AND ANAEROBIC 5CC AEROBIC/3CC ANAEROBIC   Culture  Setup Time [id number]   Final    Culture     Final    Value:        BLOOD CULTURE RECEIVED NO GROWTH TO DATE  CULTURE WILL BE HELD FOR 5 DAYS BEFORE ISSUING A FINAL NEGATIVE REPORT   Report Status PENDING   Incomplete   CULTURE, RESPIRATORY     Status: Normal (Preliminary result)   Collection Time   10/07/11  2:00 AM      Component Value Range Status Comment   Specimen Description SPUTUM   Final    Special Requests NONE   Final    Gram Stain     Final    Value: FEW WBC PRESENT, PREDOMINANTLY PMN     NO SQUAMOUS EPITHELIAL CELLS SEEN     FEW GRAM POSITIVE COCCI     IN PAIRS IN CLUSTERS RARE GRAM POSITIVE RODS     RARE GRAM NEGATIVE RODS   Culture     Final    Value: MODERATE STAPHYLOCOCCUS AUREUS     Note: RIF   Report Status PENDING   Incomplete   MRSA PCR SCREENING     Status: Abnormal   Collection Time   10/07/11  2:09 AM      Component Value Range Status Comment   MRSA by PCR POSITIVE  (*) NEGATIVE  Final   CULTURE, SPUTUM-ASSESSMENT     Status: Normal   Collection Time   10/07/11  2:09 AM      Component Value Range Status Comment   Specimen Description SPUTUM   Final    Special Requests NONE   Final    Sputum evaluation     Final    Value: THIS SPECIMEN IS ACCEPTABLE. RESPIRATORY CULTURE REPORT TO FOLLOW.   Report Status 10/07/2011 FINAL   Final   URINE CULTURE     Status: Normal   Collection Time   10/08/11  2:07 PM      Component Value Range Status Comment   Specimen Description URINE, CLEAN CATCH   Final    Special Requests NONE   Final    Culture  Setup Time [id number]   Final    Colony Count NO GROWTH   Final    Culture NO GROWTH   Final    Report Status 10/09/2011 FINAL   Final   CLOSTRIDIUM DIFFICILE BY PCR     Status: Abnormal   Collection Time   10/09/11  9:59 AM      Component Value Range Status Comment   C difficile by pcr POSITIVE (*) NEGATIVE  Final     Studies/Results: No results found.  Medications: I have reviewed the patient's current medications. Scheduled Meds:    . antiseptic oral rinse  15 mL Mouth Rinse BID  . Chlorhexidine Gluconate Cloth  6 each Topical Q0600  . dextromethorphan-guaiFENesin  1 tablet Oral BID  . enoxaparin  40 mg Subcutaneous Q24H  . levofloxacin  500 mg Oral Daily  . lisinopril  10 mg Oral Daily  . mupirocin ointment  1 application Nasal BID  . vancomycin  125 mg Oral QID  . DISCONTD: vancomycin  125 mg Oral QID   Continuous Infusions:    . sodium chloride 10 mL/hr at 10/09/11 2146   PRN Meds:.acetaminophen, acetaminophen, albuterol, alum & mag hydroxide-simeth, bisacodyl, hydrALAZINE, HYDROcodone-acetaminophen, ipratropium, ondansetron (ZOFRAN) IV, ondansetron, sodium chloride, zolpidem  Assessment/Plan: Probable community-acquired pneumonia/upper respiratory symptoms  Afebrile for the last 24 hours.  AFB culture smear pending. Urine legionella antigen negative. Blood cultures show no growth to date. Urine  strep pneumococcus antigen negative.  Respiratory culture showed moderate staph aureus.  Influenza panel negative.  CT scan with abnormal findings of pulmonary nodules  and lymphadenopathy.  Initially the patient was on empiric IV vancomycin and cefepime, which has been transitioned to levofloxacin on 10/08/2011.  Antibiotics since 10/07/2011, discussed with ID will define a 5 day course.  Continue Mucinex, nebs PRN. Pulmonary input appriciated.  Pulmonary nodules/abnormal chest CT Dr. Sherene Sires with pulmonary evaluated the patient and will need outpatient workup including PET and possible biopsy. ANCA pending.  Tumor markers such as AFP, CEA 19-9, CEA and PSA normal.  C. difficile colitis C. difficile was positive on 10/09/2011. Started on 14 day course of oral vancomycin.  Leukocytosis  Likely due to C. difficile colitis. WBC improving. Blood cx show no growth to date.  Workup and antibiotics as indicated above.  ID following. ANA negative.  Rheumatoid factor less than 10.  Encephalopathy  Likely secondary to community-acquired pneumonia.  Improved.  HTN  Stable. Patient reported that he has been taking lisinopril 10 mg daily which is not listed as his home medication. Started on this medication prior to discharge.  Hx prostateCA/Melanoma  In remission    Diarrhea  Improved.  C. difficile positive as indicated above.   Hyponatremia  Likely secondary to dehydration.  Improved.  Continue to follow.    Prophylaxis  Lovenox   Disposition  Discharge the patient home.   LOS: 4 days  Mozella Rexrode A, MD 10/10/2011, 9:53 AM

## 2011-10-11 LAB — MYCOPLASMA PNEUMONIAE ANTIBODY, IGM: Mycoplasma pneumo IgM: 1.1 Units (ref <9.0)

## 2011-10-11 LAB — CHLAMYDIA ANTIBODIES, IGG
Chlamydia Pneumoniae IgG: 1:64 {titer} — ABNORMAL HIGH
Chlamydia trachomatis IgG: <1:64 {titer}

## 2011-10-13 LAB — CULTURE, BLOOD (ROUTINE X 2)
Culture  Setup Time: 201304010816
Culture: NO GROWTH

## 2011-10-15 LAB — ANCA SCREEN W REFLEX TITER
Atypical p-ANCA Screen: NEGATIVE
c-ANCA Screen: NEGATIVE

## 2011-10-22 ENCOUNTER — Encounter: Payer: Self-pay | Admitting: Internal Medicine

## 2011-10-23 ENCOUNTER — Encounter: Payer: Self-pay | Admitting: Internal Medicine

## 2011-10-23 ENCOUNTER — Ambulatory Visit (INDEPENDENT_AMBULATORY_CARE_PROVIDER_SITE_OTHER)
Admission: RE | Admit: 2011-10-23 | Discharge: 2011-10-23 | Disposition: A | Discharge status: Home / Self Care | Payer: Medicare Other | Source: Ambulatory Visit | Attending: Internal Medicine | Admitting: Internal Medicine

## 2011-10-23 ENCOUNTER — Ambulatory Visit (INDEPENDENT_AMBULATORY_CARE_PROVIDER_SITE_OTHER): Payer: Medicare Other | Admitting: Internal Medicine

## 2011-10-23 VITALS — BP 118/80 | HR 70 | Temp 97.6°F | Ht 69.0 in | Wt 164.6 lb

## 2011-10-23 DIAGNOSIS — R918 Other nonspecific abnormal finding of lung field: Secondary | ICD-10-CM

## 2011-10-23 NOTE — Patient Instructions (Signed)
Please remember to go to the   x-ray department downstairs for your tests - we will call you with the results when they are available.  Unless the chest xray shows convincing evidence of worsening, I recommend a follow up here in 6 months. If worse, a PET scan is the next logical step to get more information about the nodules.

## 2011-10-23 NOTE — Assessment & Plan Note (Signed)
CXR shows improvement so clearly inflammatory ? Septic emboli ? Nodular boop ? Rounded areas of pna.  In any case no need for closer surveillance at this point unless symptoms recur.  See instructions for specific recommendations which were reviewed directly with the patient who was given a copy with highlighter outlining the key components.

## 2011-10-23 NOTE — Progress Notes (Signed)
Subjective:     Patient ID: Glen Henry, male   DOB: 02-17-1931  MRN: [id number]  HPI Date of Admission: 10/06/2011  Date of Discharge: 10/10/2011  Patient's PCP: Lupita Raider, MD, MD    Consults:  Dr. Sherene Sires - pulmonary  Dr. Ninetta Lights - ID  Discharge Diagnoses:  Principal Problem:  *Community acquired pneumonia  Active Problems:  Leukocytosis  H/O prostate cancer  H/O melanoma excision  Encephalopathy  Hyponatremia  Abnormal CT scan, chest  Diarrhea  C. difficile colitis  Pulmonary nodules.  Brief Admitting History and Physical  [name] who presented on 10/07/2011 with shortness of breath.  Discharge Medications  Medication List  As of 10/10/2011 10:02 AM    TAKE these medications          aspirin 325 MG tablet      Take 325 mg by mouth 2 (two) times daily.      calcium-vitamin D 500-200 MG-UNIT per tablet      Commonly known as: OSCAL WITH D      Take 1 tablet by mouth 2 (two) times daily.      dextromethorphan-guaiFENesin 30-600 MG per 12 hr tablet      Commonly known as: MUCINEX DM      Take 1 tablet by mouth 2 (two) times daily.      levofloxacin 500 MG tablet      Commonly known as: LEVAQUIN      Take 1 tablet (500 mg total) by mouth daily.      lisinopril 10 MG tablet      Commonly known as: PRINIVIL,ZESTRIL      Take 1 tablet (10 mg total) by mouth daily.      vancomycin 50 mg/mL oral solution      Commonly known as: VANCOCIN      Take 2.5 mLs (125 mg total) by mouth 4 (four) times daily. For total of 13 days from 10/10/2011. May be given at this tablets/capsules if patient prefers.        Hospital Course:  Probable community-acquired pneumonia/upper respiratory symptoms  Patient initially had intermittent episodes of fever, afebrile for the last 24 hours prior to discharge. AFB culture smear pending. Urine legionella antigen negative. Blood cultures show no growth to date. Urine strep pneumococcus antigen negative. Respiratory culture showed  moderate staph aureus. Influenza panel negative. CT scan with abnormal findings of pulmonary nodules and lymphadenopathy. Initially the patient was on empiric IV vancomycin and cefepime, which has been transitioned to levofloxacin on 10/08/2011. Antibiotics since 10/07/2011, discussed with ID will define a 5 day course. Continue Mucinex, nebs PRN. Pulmonary input appriciated. Patient per ID indicated that she did not need to be in respiratory isolation discharge.  Pulmonary nodules/abnormal chest CT  Dr. Sherene Sires with pulmonary evaluated the patient and will need outpatient workup including PET and possible biopsy. ANCA pending. Tumor markers such as AFP, CEA 19-9, CEA and PSA normal.  C. difficile colitis  C. difficile was positive on 10/09/2011. Started on 14 day course of oral vancomycin.  10/23/2011 f/u ov/Gael Londo cc the best he's felt in 5 years, no cough or limiting sob. No h/o rheumatism.  Sleeping ok without nocturnal  or early am exacerbation  of respiratory  C/o's. Also denies any obvious fluctuation of symptoms with weather or environmental changes or other aggravating or alleviating factors except as outlined above   ROS  At present neg for  any significant sore throat, dysphagia, dental problems, itching, sneezing,  nasal congestion or excess/  purulent secretions, ear ache,   fever, chills, sweats, unintended wt loss, pleuritic or exertional cp, hemoptysis, palpitations, orthopnea pnd or leg swelling.  Also denies presyncope, palpitations, heartburn, abdominal pain, anorexia, nausea, vomiting, diarrhea  or change in bowel or urinary habits, change in stools or urine, dysuria,hematuria,  rash, arthralgias, visual complaints, headache, numbness weakness or ataxia or problems with walking or coordination. No noted change in mood/affect or memory.                    Review of Systems     Objective:   Physical Exam Wt 164 10/23/2011  Wt Readings from Last 3 Encounters:  10/23/11 164 lb  9.6 oz (74.662 kg)  10/09/11 170 lb 13.7 oz (77.5 kg)  10/06/11 172 lb (78.019 kg)      pleasant but a bit frail elderly wm nad HEENT: nl dentition, turbinates, and orophanx. Nl external ear canals without cough reflex   NECK :  without JVD/Nodes/TM/ nl carotid upstrokes bilaterally   LUNGS: no acc muscle use, clear to A and P bilaterally without cough on insp or exp maneuvers   CV:  RRR  no s3 or murmur or increase in P2, no edema   ABD:  soft and nontender with nl excursion in the supine position. No bruits or organomegaly, bowel sounds nl  MS:  warm without deformities, calf tenderness, cyanosis or clubbing  SKIN: warm and dry without lesions    NEURO:  alert, approp, no deficits    CXR  10/23/2011 :  Improved appearance of pulmonary nodules. Recommend continued follow-up clearing.   Assessment:          Plan:

## 2011-10-25 ENCOUNTER — Telehealth: Payer: Self-pay | Admitting: Internal Medicine

## 2011-10-25 NOTE — Telephone Encounter (Signed)
Notes Recorded by Nyoka Cowden, MD on 10/23/2011 at 5:04 PM Call pt: Reviewed cxr and no acute change so no change in recommendations made at ov- nodules are better and definitely don't require biopsy at this point.   I spoke with patient about results and he verbalized understanding and had no questions

## 2012-04-23 ENCOUNTER — Ambulatory Visit (INDEPENDENT_AMBULATORY_CARE_PROVIDER_SITE_OTHER)
Admission: RE | Admit: 2012-04-23 | Discharge: 2012-04-23 | Disposition: A | Discharge status: Home / Self Care | Payer: Medicare Other | Source: Ambulatory Visit | Attending: Internal Medicine | Admitting: Internal Medicine

## 2012-04-23 ENCOUNTER — Encounter: Payer: Self-pay | Admitting: Internal Medicine

## 2012-04-23 ENCOUNTER — Ambulatory Visit (INDEPENDENT_AMBULATORY_CARE_PROVIDER_SITE_OTHER): Payer: Medicare Other | Admitting: Internal Medicine

## 2012-04-23 VITALS — BP 110/70 | HR 61 | Temp 97.4°F | Ht 69.0 in | Wt 171.0 lb

## 2012-04-23 DIAGNOSIS — R918 Other nonspecific abnormal finding of lung field: Secondary | ICD-10-CM

## 2012-04-23 NOTE — Progress Notes (Signed)
Subjective:     Patient ID: Glen Henry, male   DOB: Jun 07, 1931  MRN: [id number]  HPI Date of Admission: 10/06/2011  Date of Discharge: 10/10/2011  Patient's PCP: Glen Raider, MD, MD    Consults:  Dr. Sherene Sires - pulmonary  Dr. Ninetta Lights - ID  Discharge Diagnoses:  Principal Problem:  *Community acquired pneumonia  Active Problems:  Leukocytosis  H/O prostate cancer  H/O melanoma excision  Encephalopathy  Hyponatremia  Abnormal CT scan, chest  Diarrhea  C. difficile colitis  Pulmonary nodules.  Brief Admitting History and Physical  [name] who presented on 10/07/2011 with shortness of breath.  Discharge Medications  Medication List  As of 10/10/2011 10:02 AM    TAKE these medications          aspirin 325 MG tablet      Take 325 mg by mouth 2 (two) times daily.      calcium-vitamin D 500-200 MG-UNIT per tablet      Commonly known as: OSCAL WITH D      Take 1 tablet by mouth 2 (two) times daily.      dextromethorphan-guaiFENesin 30-600 MG per 12 hr tablet      Commonly known as: MUCINEX DM      Take 1 tablet by mouth 2 (two) times daily.      levofloxacin 500 MG tablet      Commonly known as: LEVAQUIN      Take 1 tablet (500 mg total) by mouth daily.      lisinopril 10 MG tablet      Commonly known as: PRINIVIL,ZESTRIL      Take 1 tablet (10 mg total) by mouth daily.      vancomycin 50 mg/mL oral solution      Commonly known as: VANCOCIN      Take 2.5 mLs (125 mg total) by mouth 4 (four) times daily. For total of 13 days from 10/10/2011. May be given at this tablets/capsules if patient prefers.        Hospital Course:  Probable community-acquired pneumonia/upper respiratory symptoms  Patient initially had intermittent episodes of fever, afebrile for the last 24 hours prior to discharge. AFB culture smear pending. Urine legionella antigen negative. Blood cultures show no growth to date. Urine strep pneumococcus antigen negative. Respiratory culture showed  moderate staph aureus. Influenza panel negative. CT scan with abnormal findings of pulmonary nodules and lymphadenopathy. Initially the patient was on empiric IV vancomycin and cefepime, which has been transitioned to levofloxacin on 10/08/2011. Antibiotics since 10/07/2011, discussed with ID will define a 5 day course. Continue Mucinex, nebs PRN. Pulmonary input appriciated. Patient per ID indicated that she did not need to be in respiratory isolation discharge.  Pulmonary nodules/abnormal chest CT  Dr. Sherene Sires with pulmonary evaluated the patient and will need outpatient workup including PET and possible biopsy. ANCA pending. Tumor markers such as AFP, CEA 19-9, CEA and PSA normal.  C. difficile colitis  C. difficile was positive on 10/09/2011. Started on 14 day course of oral vancomycin.  10/23/2011 f/u ov/Jasmarie Coppock cc the best he's felt in 5 years, no cough or limiting sob. No h/o rheumatism. cxr better rec F/u cxr in 6 mo   04/23/2012 f/u ov/Ryzen Deady for f/u final cxr cc no cough, no sob, min nasal congestion, no purulent sputum   Sleeping ok without nocturnal  or early am exacerbation  of respiratory  C/o's. Also denies any obvious fluctuation of symptoms with weather or environmental changes or other aggravating or  alleviating factors except as outlined above   ROS  The following are not active complaints unless bolded sore throat, dysphagia, dental problems, itching, sneezing,  nasal congestion or excess/ purulent secretions, ear ache,   fever, chills, sweats, unintended wt loss, pleuritic or exertional cp, hemoptysis,  orthopnea pnd or leg swelling, presyncope, palpitations, heartburn, abdominal pain, anorexia, nausea, vomiting, diarrhea  or change in bowel or urinary habits, change in stools or urine, dysuria,hematuria,  rash, arthralgias, visual complaints, headache, numbness weakness or ataxia or problems with walking or coordination,  change in mood/affect or memory.    .                          Objective:   Physical Exam Wt 164 10/23/2011 vs 04/23/2012  171 10/06/11 172 lb (78.019 kg)      pleasant but a bit frail elderly wm nad HEENT: nl dentition, turbinates, and orophanx. Nl external ear canals without cough reflex   NECK :  without JVD/Nodes/TM/ nl carotid upstrokes bilaterally   LUNGS: no acc muscle use, clear to A and P bilaterally without cough on insp or exp maneuvers   CV:  RRR  no s3 or murmur or increase in P2, no edema   ABD:  soft and nontender with nl excursion in the supine position. No bruits or organomegaly, bowel sounds nl  MS:  warm without deformities, calf tenderness, cyanosis or clubbing  SKIN: warm and dry without lesions    NEURO:  alert, approp, no deficits    CXR  04/23/2012 :  Previously seen pulmonary nodules are not visible on today's study. No acute finding.     Assessment:          Plan:

## 2012-04-23 NOTE — Progress Notes (Signed)
Quick Note:  Spoke with pt and notified of results per Dr. Wert. Pt verbalized understanding and denied any questions.  ______ 

## 2012-04-23 NOTE — Assessment & Plan Note (Signed)
Completely resolved  C/w post infectious process with gradual but complete lung recovery > no further f/u needed

## 2012-04-23 NOTE — Patient Instructions (Addendum)
Please remember to go to  x-ray department downstairs for your tests - we will call you with the results when they are available.  Pulmonary follow up will be as needed

## 2013-03-26 ENCOUNTER — Encounter (INDEPENDENT_AMBULATORY_CARE_PROVIDER_SITE_OTHER): Payer: Medicare Other | Admitting: Ophthalmology

## 2013-03-26 DIAGNOSIS — H35359 Cystoid macular degeneration, unspecified eye: Secondary | ICD-10-CM

## 2013-03-26 DIAGNOSIS — H43819 Vitreous degeneration, unspecified eye: Secondary | ICD-10-CM

## 2013-03-26 DIAGNOSIS — H35379 Puckering of macula, unspecified eye: Secondary | ICD-10-CM

## 2013-03-26 DIAGNOSIS — I10 Essential (primary) hypertension: Secondary | ICD-10-CM

## 2013-03-26 DIAGNOSIS — H3530 Unspecified macular degeneration: Secondary | ICD-10-CM

## 2013-03-26 DIAGNOSIS — H35039 Hypertensive retinopathy, unspecified eye: Secondary | ICD-10-CM

## 2013-05-10 ENCOUNTER — Encounter (INDEPENDENT_AMBULATORY_CARE_PROVIDER_SITE_OTHER): Payer: Medicare Other | Admitting: Ophthalmology

## 2013-05-10 DIAGNOSIS — H35379 Puckering of macula, unspecified eye: Secondary | ICD-10-CM

## 2013-05-10 DIAGNOSIS — I10 Essential (primary) hypertension: Secondary | ICD-10-CM

## 2013-05-10 DIAGNOSIS — H3530 Unspecified macular degeneration: Secondary | ICD-10-CM

## 2013-05-10 DIAGNOSIS — H35359 Cystoid macular degeneration, unspecified eye: Secondary | ICD-10-CM

## 2013-05-10 DIAGNOSIS — H43819 Vitreous degeneration, unspecified eye: Secondary | ICD-10-CM

## 2013-05-10 DIAGNOSIS — H35039 Hypertensive retinopathy, unspecified eye: Secondary | ICD-10-CM

## 2013-06-21 ENCOUNTER — Encounter (INDEPENDENT_AMBULATORY_CARE_PROVIDER_SITE_OTHER): Payer: Medicare Other | Admitting: Ophthalmology

## 2013-06-21 DIAGNOSIS — H35379 Puckering of macula, unspecified eye: Secondary | ICD-10-CM

## 2013-06-21 DIAGNOSIS — H35039 Hypertensive retinopathy, unspecified eye: Secondary | ICD-10-CM

## 2013-06-21 DIAGNOSIS — I10 Essential (primary) hypertension: Secondary | ICD-10-CM

## 2013-06-21 DIAGNOSIS — H35359 Cystoid macular degeneration, unspecified eye: Secondary | ICD-10-CM

## 2013-06-21 DIAGNOSIS — H3530 Unspecified macular degeneration: Secondary | ICD-10-CM

## 2013-06-21 DIAGNOSIS — H43819 Vitreous degeneration, unspecified eye: Secondary | ICD-10-CM

## 2013-08-02 ENCOUNTER — Encounter (INDEPENDENT_AMBULATORY_CARE_PROVIDER_SITE_OTHER): Payer: Medicare Other | Admitting: Ophthalmology

## 2013-08-05 ENCOUNTER — Encounter (INDEPENDENT_AMBULATORY_CARE_PROVIDER_SITE_OTHER): Payer: Commercial Managed Care - HMO | Admitting: Ophthalmology

## 2013-08-10 ENCOUNTER — Encounter (INDEPENDENT_AMBULATORY_CARE_PROVIDER_SITE_OTHER): Payer: Medicare HMO | Admitting: Ophthalmology

## 2013-08-10 DIAGNOSIS — I10 Essential (primary) hypertension: Secondary | ICD-10-CM

## 2013-08-10 DIAGNOSIS — H35359 Cystoid macular degeneration, unspecified eye: Secondary | ICD-10-CM

## 2013-08-10 DIAGNOSIS — H35379 Puckering of macula, unspecified eye: Secondary | ICD-10-CM

## 2013-08-10 DIAGNOSIS — H35039 Hypertensive retinopathy, unspecified eye: Secondary | ICD-10-CM

## 2013-08-10 DIAGNOSIS — H43819 Vitreous degeneration, unspecified eye: Secondary | ICD-10-CM

## 2013-09-21 ENCOUNTER — Encounter (INDEPENDENT_AMBULATORY_CARE_PROVIDER_SITE_OTHER): Payer: Medicare HMO | Admitting: Ophthalmology

## 2013-09-21 DIAGNOSIS — I10 Essential (primary) hypertension: Secondary | ICD-10-CM

## 2013-09-21 DIAGNOSIS — H35039 Hypertensive retinopathy, unspecified eye: Secondary | ICD-10-CM

## 2013-09-21 DIAGNOSIS — H3530 Unspecified macular degeneration: Secondary | ICD-10-CM

## 2013-09-21 DIAGNOSIS — H43819 Vitreous degeneration, unspecified eye: Secondary | ICD-10-CM

## 2013-09-21 DIAGNOSIS — H35379 Puckering of macula, unspecified eye: Secondary | ICD-10-CM

## 2013-09-21 DIAGNOSIS — H35359 Cystoid macular degeneration, unspecified eye: Secondary | ICD-10-CM

## 2013-11-22 ENCOUNTER — Encounter (INDEPENDENT_AMBULATORY_CARE_PROVIDER_SITE_OTHER): Payer: Medicare HMO | Admitting: Ophthalmology

## 2013-11-22 DIAGNOSIS — I10 Essential (primary) hypertension: Secondary | ICD-10-CM

## 2013-11-22 DIAGNOSIS — H35379 Puckering of macula, unspecified eye: Secondary | ICD-10-CM

## 2013-11-22 DIAGNOSIS — H35039 Hypertensive retinopathy, unspecified eye: Secondary | ICD-10-CM

## 2013-11-22 DIAGNOSIS — H35359 Cystoid macular degeneration, unspecified eye: Secondary | ICD-10-CM

## 2013-11-22 DIAGNOSIS — H43819 Vitreous degeneration, unspecified eye: Secondary | ICD-10-CM

## 2013-11-22 DIAGNOSIS — H3530 Unspecified macular degeneration: Secondary | ICD-10-CM

## 2014-02-22 ENCOUNTER — Encounter (INDEPENDENT_AMBULATORY_CARE_PROVIDER_SITE_OTHER): Payer: Commercial Managed Care - HMO | Admitting: Ophthalmology

## 2014-02-22 DIAGNOSIS — H35379 Puckering of macula, unspecified eye: Secondary | ICD-10-CM

## 2014-02-22 DIAGNOSIS — H35359 Cystoid macular degeneration, unspecified eye: Secondary | ICD-10-CM

## 2014-02-22 DIAGNOSIS — H3530 Unspecified macular degeneration: Secondary | ICD-10-CM

## 2014-02-22 DIAGNOSIS — H35039 Hypertensive retinopathy, unspecified eye: Secondary | ICD-10-CM

## 2014-02-22 DIAGNOSIS — H43819 Vitreous degeneration, unspecified eye: Secondary | ICD-10-CM

## 2014-02-22 DIAGNOSIS — I10 Essential (primary) hypertension: Secondary | ICD-10-CM

## 2014-06-27 ENCOUNTER — Ambulatory Visit (INDEPENDENT_AMBULATORY_CARE_PROVIDER_SITE_OTHER): Payer: Commercial Managed Care - HMO | Admitting: Ophthalmology

## 2014-06-27 DIAGNOSIS — H35372 Puckering of macula, left eye: Secondary | ICD-10-CM

## 2014-06-27 DIAGNOSIS — H43813 Vitreous degeneration, bilateral: Secondary | ICD-10-CM

## 2014-06-27 DIAGNOSIS — H35033 Hypertensive retinopathy, bilateral: Secondary | ICD-10-CM

## 2014-06-27 DIAGNOSIS — H3531 Nonexudative age-related macular degeneration: Secondary | ICD-10-CM

## 2014-06-27 DIAGNOSIS — H59031 Cystoid macular edema following cataract surgery, right eye: Secondary | ICD-10-CM

## 2014-10-31 ENCOUNTER — Ambulatory Visit (INDEPENDENT_AMBULATORY_CARE_PROVIDER_SITE_OTHER): Payer: PPO | Admitting: Ophthalmology

## 2014-10-31 DIAGNOSIS — H59031 Cystoid macular edema following cataract surgery, right eye: Secondary | ICD-10-CM | POA: Diagnosis not present

## 2014-10-31 DIAGNOSIS — H35033 Hypertensive retinopathy, bilateral: Secondary | ICD-10-CM

## 2014-10-31 DIAGNOSIS — H3531 Nonexudative age-related macular degeneration: Secondary | ICD-10-CM | POA: Diagnosis not present

## 2014-10-31 DIAGNOSIS — H43813 Vitreous degeneration, bilateral: Secondary | ICD-10-CM | POA: Diagnosis not present

## 2014-10-31 DIAGNOSIS — I10 Essential (primary) hypertension: Secondary | ICD-10-CM

## 2014-10-31 DIAGNOSIS — H35373 Puckering of macula, bilateral: Secondary | ICD-10-CM

## 2015-03-02 ENCOUNTER — Ambulatory Visit (INDEPENDENT_AMBULATORY_CARE_PROVIDER_SITE_OTHER): Payer: PPO | Admitting: Ophthalmology

## 2015-03-23 ENCOUNTER — Ambulatory Visit (INDEPENDENT_AMBULATORY_CARE_PROVIDER_SITE_OTHER): Payer: PPO | Admitting: Ophthalmology

## 2015-03-23 DIAGNOSIS — H43813 Vitreous degeneration, bilateral: Secondary | ICD-10-CM

## 2015-03-23 DIAGNOSIS — H59031 Cystoid macular edema following cataract surgery, right eye: Secondary | ICD-10-CM

## 2015-03-23 DIAGNOSIS — H35033 Hypertensive retinopathy, bilateral: Secondary | ICD-10-CM

## 2015-03-23 DIAGNOSIS — H3531 Nonexudative age-related macular degeneration: Secondary | ICD-10-CM | POA: Diagnosis not present

## 2015-03-23 DIAGNOSIS — I10 Essential (primary) hypertension: Secondary | ICD-10-CM | POA: Diagnosis not present

## 2015-03-23 DIAGNOSIS — H35373 Puckering of macula, bilateral: Secondary | ICD-10-CM

## 2015-09-20 DIAGNOSIS — M545 Low back pain: Secondary | ICD-10-CM | POA: Diagnosis not present

## 2015-09-20 DIAGNOSIS — E782 Mixed hyperlipidemia: Secondary | ICD-10-CM | POA: Diagnosis not present

## 2015-09-20 DIAGNOSIS — E559 Vitamin D deficiency, unspecified: Secondary | ICD-10-CM | POA: Diagnosis not present

## 2015-09-20 DIAGNOSIS — F411 Generalized anxiety disorder: Secondary | ICD-10-CM | POA: Diagnosis not present

## 2015-09-20 DIAGNOSIS — Z8546 Personal history of malignant neoplasm of prostate: Secondary | ICD-10-CM | POA: Diagnosis not present

## 2015-09-20 DIAGNOSIS — I10 Essential (primary) hypertension: Secondary | ICD-10-CM | POA: Diagnosis not present

## 2015-09-20 DIAGNOSIS — Z85820 Personal history of malignant melanoma of skin: Secondary | ICD-10-CM | POA: Diagnosis not present

## 2015-09-20 DIAGNOSIS — Z0000 Encounter for general adult medical examination without abnormal findings: Secondary | ICD-10-CM | POA: Diagnosis not present

## 2015-09-27 ENCOUNTER — Ambulatory Visit (INDEPENDENT_AMBULATORY_CARE_PROVIDER_SITE_OTHER): Payer: PPO | Admitting: Ophthalmology

## 2015-09-27 DIAGNOSIS — H35033 Hypertensive retinopathy, bilateral: Secondary | ICD-10-CM | POA: Diagnosis not present

## 2015-09-27 DIAGNOSIS — I10 Essential (primary) hypertension: Secondary | ICD-10-CM | POA: Diagnosis not present

## 2015-09-27 DIAGNOSIS — H59031 Cystoid macular edema following cataract surgery, right eye: Secondary | ICD-10-CM

## 2015-09-27 DIAGNOSIS — H353122 Nonexudative age-related macular degeneration, left eye, intermediate dry stage: Secondary | ICD-10-CM

## 2015-09-27 DIAGNOSIS — H353111 Nonexudative age-related macular degeneration, right eye, early dry stage: Secondary | ICD-10-CM | POA: Diagnosis not present

## 2015-09-27 DIAGNOSIS — H35373 Puckering of macula, bilateral: Secondary | ICD-10-CM | POA: Diagnosis not present

## 2015-09-27 DIAGNOSIS — H43813 Vitreous degeneration, bilateral: Secondary | ICD-10-CM | POA: Diagnosis not present

## 2016-04-05 ENCOUNTER — Ambulatory Visit (INDEPENDENT_AMBULATORY_CARE_PROVIDER_SITE_OTHER): Payer: PPO | Admitting: Ophthalmology

## 2016-04-05 DIAGNOSIS — H353132 Nonexudative age-related macular degeneration, bilateral, intermediate dry stage: Secondary | ICD-10-CM | POA: Diagnosis not present

## 2016-04-05 DIAGNOSIS — H35373 Puckering of macula, bilateral: Secondary | ICD-10-CM

## 2016-04-05 DIAGNOSIS — H59031 Cystoid macular edema following cataract surgery, right eye: Secondary | ICD-10-CM

## 2016-04-05 DIAGNOSIS — I10 Essential (primary) hypertension: Secondary | ICD-10-CM

## 2016-04-05 DIAGNOSIS — H35033 Hypertensive retinopathy, bilateral: Secondary | ICD-10-CM | POA: Diagnosis not present

## 2016-04-05 DIAGNOSIS — H43813 Vitreous degeneration, bilateral: Secondary | ICD-10-CM

## 2016-04-10 ENCOUNTER — Other Ambulatory Visit: Payer: Self-pay | Admitting: Family Medicine

## 2016-04-10 ENCOUNTER — Ambulatory Visit
Admission: RE | Admit: 2016-04-10 | Discharge: 2016-04-10 | Disposition: A | Discharge status: Home / Self Care | Payer: PPO | Source: Ambulatory Visit | Attending: Family Medicine | Admitting: Family Medicine

## 2016-04-10 DIAGNOSIS — R634 Abnormal weight loss: Secondary | ICD-10-CM

## 2016-04-10 DIAGNOSIS — R490 Dysphonia: Secondary | ICD-10-CM | POA: Diagnosis not present

## 2016-04-10 DIAGNOSIS — L298 Other pruritus: Secondary | ICD-10-CM | POA: Diagnosis not present

## 2016-04-10 DIAGNOSIS — F411 Generalized anxiety disorder: Secondary | ICD-10-CM | POA: Diagnosis not present

## 2016-04-10 DIAGNOSIS — E782 Mixed hyperlipidemia: Secondary | ICD-10-CM | POA: Diagnosis not present

## 2016-04-10 DIAGNOSIS — I10 Essential (primary) hypertension: Secondary | ICD-10-CM | POA: Diagnosis not present

## 2016-04-10 DIAGNOSIS — R05 Cough: Secondary | ICD-10-CM | POA: Diagnosis not present

## 2016-10-04 ENCOUNTER — Ambulatory Visit (INDEPENDENT_AMBULATORY_CARE_PROVIDER_SITE_OTHER): Payer: PPO | Admitting: Ophthalmology

## 2016-10-04 DIAGNOSIS — H35373 Puckering of macula, bilateral: Secondary | ICD-10-CM

## 2016-10-04 DIAGNOSIS — H35033 Hypertensive retinopathy, bilateral: Secondary | ICD-10-CM | POA: Diagnosis not present

## 2016-10-04 DIAGNOSIS — H43813 Vitreous degeneration, bilateral: Secondary | ICD-10-CM

## 2016-10-04 DIAGNOSIS — H353111 Nonexudative age-related macular degeneration, right eye, early dry stage: Secondary | ICD-10-CM | POA: Diagnosis not present

## 2016-10-04 DIAGNOSIS — H353122 Nonexudative age-related macular degeneration, left eye, intermediate dry stage: Secondary | ICD-10-CM | POA: Diagnosis not present

## 2016-10-04 DIAGNOSIS — I10 Essential (primary) hypertension: Secondary | ICD-10-CM

## 2016-11-08 DIAGNOSIS — Z0000 Encounter for general adult medical examination without abnormal findings: Secondary | ICD-10-CM | POA: Diagnosis not present

## 2016-11-08 DIAGNOSIS — Z8546 Personal history of malignant neoplasm of prostate: Secondary | ICD-10-CM | POA: Diagnosis not present

## 2016-11-08 DIAGNOSIS — B356 Tinea cruris: Secondary | ICD-10-CM | POA: Diagnosis not present

## 2016-11-08 DIAGNOSIS — I10 Essential (primary) hypertension: Secondary | ICD-10-CM | POA: Diagnosis not present

## 2016-11-08 DIAGNOSIS — F411 Generalized anxiety disorder: Secondary | ICD-10-CM | POA: Diagnosis not present

## 2016-11-08 DIAGNOSIS — Z85820 Personal history of malignant melanoma of skin: Secondary | ICD-10-CM | POA: Diagnosis not present

## 2016-11-08 DIAGNOSIS — E782 Mixed hyperlipidemia: Secondary | ICD-10-CM | POA: Diagnosis not present

## 2016-11-08 DIAGNOSIS — E559 Vitamin D deficiency, unspecified: Secondary | ICD-10-CM | POA: Diagnosis not present

## 2017-04-11 ENCOUNTER — Ambulatory Visit (INDEPENDENT_AMBULATORY_CARE_PROVIDER_SITE_OTHER): Payer: PPO | Admitting: Ophthalmology

## 2017-04-11 DIAGNOSIS — H353111 Nonexudative age-related macular degeneration, right eye, early dry stage: Secondary | ICD-10-CM

## 2017-04-11 DIAGNOSIS — H353122 Nonexudative age-related macular degeneration, left eye, intermediate dry stage: Secondary | ICD-10-CM | POA: Diagnosis not present

## 2017-04-11 DIAGNOSIS — I10 Essential (primary) hypertension: Secondary | ICD-10-CM | POA: Diagnosis not present

## 2017-04-11 DIAGNOSIS — H43813 Vitreous degeneration, bilateral: Secondary | ICD-10-CM

## 2017-04-11 DIAGNOSIS — H35373 Puckering of macula, bilateral: Secondary | ICD-10-CM | POA: Diagnosis not present

## 2017-04-11 DIAGNOSIS — H35033 Hypertensive retinopathy, bilateral: Secondary | ICD-10-CM

## 2017-05-12 DIAGNOSIS — E782 Mixed hyperlipidemia: Secondary | ICD-10-CM | POA: Diagnosis not present

## 2017-05-12 DIAGNOSIS — I10 Essential (primary) hypertension: Secondary | ICD-10-CM | POA: Diagnosis not present

## 2017-05-12 DIAGNOSIS — M545 Low back pain: Secondary | ICD-10-CM | POA: Diagnosis not present

## 2017-07-08 DIAGNOSIS — C801 Malignant (primary) neoplasm, unspecified: Secondary | ICD-10-CM

## 2017-07-08 HISTORY — DX: Malignant (primary) neoplasm, unspecified: C80.1

## 2017-07-15 DIAGNOSIS — B356 Tinea cruris: Secondary | ICD-10-CM | POA: Diagnosis not present

## 2017-07-15 DIAGNOSIS — Z8546 Personal history of malignant neoplasm of prostate: Secondary | ICD-10-CM | POA: Diagnosis not present

## 2017-08-18 DIAGNOSIS — D692 Other nonthrombocytopenic purpura: Secondary | ICD-10-CM | POA: Diagnosis not present

## 2017-08-18 DIAGNOSIS — D485 Neoplasm of uncertain behavior of skin: Secondary | ICD-10-CM | POA: Diagnosis not present

## 2017-08-18 DIAGNOSIS — L821 Other seborrheic keratosis: Secondary | ICD-10-CM | POA: Diagnosis not present

## 2017-08-18 DIAGNOSIS — L304 Erythema intertrigo: Secondary | ICD-10-CM | POA: Diagnosis not present

## 2017-08-18 DIAGNOSIS — D1801 Hemangioma of skin and subcutaneous tissue: Secondary | ICD-10-CM | POA: Diagnosis not present

## 2017-10-10 ENCOUNTER — Encounter (INDEPENDENT_AMBULATORY_CARE_PROVIDER_SITE_OTHER): Payer: PPO | Admitting: Ophthalmology

## 2017-10-10 DIAGNOSIS — H59033 Cystoid macular edema following cataract surgery, bilateral: Secondary | ICD-10-CM

## 2017-10-10 DIAGNOSIS — H43813 Vitreous degeneration, bilateral: Secondary | ICD-10-CM | POA: Diagnosis not present

## 2017-10-10 DIAGNOSIS — H35033 Hypertensive retinopathy, bilateral: Secondary | ICD-10-CM | POA: Diagnosis not present

## 2017-10-10 DIAGNOSIS — H353132 Nonexudative age-related macular degeneration, bilateral, intermediate dry stage: Secondary | ICD-10-CM | POA: Diagnosis not present

## 2017-10-10 DIAGNOSIS — H35373 Puckering of macula, bilateral: Secondary | ICD-10-CM | POA: Diagnosis not present

## 2017-10-10 DIAGNOSIS — I10 Essential (primary) hypertension: Secondary | ICD-10-CM | POA: Diagnosis not present

## 2017-12-04 DIAGNOSIS — E782 Mixed hyperlipidemia: Secondary | ICD-10-CM | POA: Diagnosis not present

## 2017-12-04 DIAGNOSIS — F411 Generalized anxiety disorder: Secondary | ICD-10-CM | POA: Diagnosis not present

## 2017-12-04 DIAGNOSIS — Z8546 Personal history of malignant neoplasm of prostate: Secondary | ICD-10-CM | POA: Diagnosis not present

## 2017-12-04 DIAGNOSIS — W57XXXA Bitten or stung by nonvenomous insect and other nonvenomous arthropods, initial encounter: Secondary | ICD-10-CM | POA: Diagnosis not present

## 2017-12-04 DIAGNOSIS — Z0000 Encounter for general adult medical examination without abnormal findings: Secondary | ICD-10-CM | POA: Diagnosis not present

## 2017-12-04 DIAGNOSIS — I10 Essential (primary) hypertension: Secondary | ICD-10-CM | POA: Diagnosis not present

## 2017-12-04 DIAGNOSIS — E559 Vitamin D deficiency, unspecified: Secondary | ICD-10-CM | POA: Diagnosis not present

## 2017-12-04 DIAGNOSIS — Z85820 Personal history of malignant melanoma of skin: Secondary | ICD-10-CM | POA: Diagnosis not present

## 2017-12-04 DIAGNOSIS — S30860A Insect bite (nonvenomous) of lower back and pelvis, initial encounter: Secondary | ICD-10-CM | POA: Diagnosis not present

## 2018-04-13 ENCOUNTER — Encounter (INDEPENDENT_AMBULATORY_CARE_PROVIDER_SITE_OTHER): Payer: PPO | Admitting: Ophthalmology

## 2018-05-11 ENCOUNTER — Encounter (INDEPENDENT_AMBULATORY_CARE_PROVIDER_SITE_OTHER): Payer: PPO | Admitting: Ophthalmology

## 2018-05-11 DIAGNOSIS — H35033 Hypertensive retinopathy, bilateral: Secondary | ICD-10-CM | POA: Diagnosis not present

## 2018-05-11 DIAGNOSIS — H35373 Puckering of macula, bilateral: Secondary | ICD-10-CM | POA: Diagnosis not present

## 2018-05-11 DIAGNOSIS — H43813 Vitreous degeneration, bilateral: Secondary | ICD-10-CM

## 2018-05-11 DIAGNOSIS — I10 Essential (primary) hypertension: Secondary | ICD-10-CM

## 2018-05-11 DIAGNOSIS — H59033 Cystoid macular edema following cataract surgery, bilateral: Secondary | ICD-10-CM

## 2018-06-18 DIAGNOSIS — F411 Generalized anxiety disorder: Secondary | ICD-10-CM | POA: Diagnosis not present

## 2018-06-18 DIAGNOSIS — K219 Gastro-esophageal reflux disease without esophagitis: Secondary | ICD-10-CM | POA: Diagnosis not present

## 2018-06-18 DIAGNOSIS — Z23 Encounter for immunization: Secondary | ICD-10-CM | POA: Diagnosis not present

## 2018-06-18 DIAGNOSIS — I10 Essential (primary) hypertension: Secondary | ICD-10-CM | POA: Diagnosis not present

## 2018-06-18 DIAGNOSIS — E782 Mixed hyperlipidemia: Secondary | ICD-10-CM | POA: Diagnosis not present

## 2018-07-10 DIAGNOSIS — R21 Rash and other nonspecific skin eruption: Secondary | ICD-10-CM | POA: Diagnosis not present

## 2018-07-10 DIAGNOSIS — I10 Essential (primary) hypertension: Secondary | ICD-10-CM | POA: Diagnosis not present

## 2018-11-09 ENCOUNTER — Encounter (INDEPENDENT_AMBULATORY_CARE_PROVIDER_SITE_OTHER): Payer: PPO | Admitting: Ophthalmology

## 2018-11-12 DIAGNOSIS — B354 Tinea corporis: Secondary | ICD-10-CM | POA: Diagnosis not present

## 2018-11-12 DIAGNOSIS — L299 Pruritus, unspecified: Secondary | ICD-10-CM | POA: Diagnosis not present

## 2019-03-03 DIAGNOSIS — E559 Vitamin D deficiency, unspecified: Secondary | ICD-10-CM | POA: Diagnosis not present

## 2019-03-03 DIAGNOSIS — E782 Mixed hyperlipidemia: Secondary | ICD-10-CM | POA: Diagnosis not present

## 2019-03-03 DIAGNOSIS — I10 Essential (primary) hypertension: Secondary | ICD-10-CM | POA: Diagnosis not present

## 2019-03-08 DIAGNOSIS — R1310 Dysphagia, unspecified: Secondary | ICD-10-CM | POA: Diagnosis not present

## 2019-03-08 DIAGNOSIS — Z8546 Personal history of malignant neoplasm of prostate: Secondary | ICD-10-CM | POA: Diagnosis not present

## 2019-03-08 DIAGNOSIS — F411 Generalized anxiety disorder: Secondary | ICD-10-CM | POA: Diagnosis not present

## 2019-03-08 DIAGNOSIS — E782 Mixed hyperlipidemia: Secondary | ICD-10-CM | POA: Diagnosis not present

## 2019-03-08 DIAGNOSIS — E559 Vitamin D deficiency, unspecified: Secondary | ICD-10-CM | POA: Diagnosis not present

## 2019-03-08 DIAGNOSIS — I10 Essential (primary) hypertension: Secondary | ICD-10-CM | POA: Diagnosis not present

## 2019-03-08 DIAGNOSIS — Z85820 Personal history of malignant melanoma of skin: Secondary | ICD-10-CM | POA: Diagnosis not present

## 2019-03-08 DIAGNOSIS — Z0000 Encounter for general adult medical examination without abnormal findings: Secondary | ICD-10-CM | POA: Diagnosis not present

## 2019-03-08 DIAGNOSIS — Z7189 Other specified counseling: Secondary | ICD-10-CM | POA: Diagnosis not present

## 2019-09-09 DIAGNOSIS — I10 Essential (primary) hypertension: Secondary | ICD-10-CM | POA: Diagnosis not present

## 2019-09-14 DIAGNOSIS — Z7189 Other specified counseling: Secondary | ICD-10-CM | POA: Diagnosis not present

## 2019-09-14 DIAGNOSIS — I10 Essential (primary) hypertension: Secondary | ICD-10-CM | POA: Diagnosis not present

## 2019-09-14 DIAGNOSIS — Z8546 Personal history of malignant neoplasm of prostate: Secondary | ICD-10-CM | POA: Diagnosis not present

## 2019-09-14 DIAGNOSIS — F411 Generalized anxiety disorder: Secondary | ICD-10-CM | POA: Diagnosis not present

## 2019-09-14 DIAGNOSIS — E782 Mixed hyperlipidemia: Secondary | ICD-10-CM | POA: Diagnosis not present

## 2020-02-11 DIAGNOSIS — T63461A Toxic effect of venom of wasps, accidental (unintentional), initial encounter: Secondary | ICD-10-CM | POA: Diagnosis not present

## 2020-03-03 DIAGNOSIS — L0291 Cutaneous abscess, unspecified: Secondary | ICD-10-CM | POA: Diagnosis not present

## 2020-03-03 DIAGNOSIS — L03115 Cellulitis of right lower limb: Secondary | ICD-10-CM | POA: Diagnosis not present

## 2020-03-03 DIAGNOSIS — L02415 Cutaneous abscess of right lower limb: Secondary | ICD-10-CM | POA: Diagnosis not present

## 2020-03-15 DIAGNOSIS — I10 Essential (primary) hypertension: Secondary | ICD-10-CM | POA: Diagnosis not present

## 2020-03-15 DIAGNOSIS — F411 Generalized anxiety disorder: Secondary | ICD-10-CM | POA: Diagnosis not present

## 2020-03-15 DIAGNOSIS — E782 Mixed hyperlipidemia: Secondary | ICD-10-CM | POA: Diagnosis not present

## 2020-03-15 DIAGNOSIS — Z8546 Personal history of malignant neoplasm of prostate: Secondary | ICD-10-CM | POA: Diagnosis not present

## 2020-03-15 DIAGNOSIS — Z85820 Personal history of malignant melanoma of skin: Secondary | ICD-10-CM | POA: Diagnosis not present

## 2020-03-15 DIAGNOSIS — Z0000 Encounter for general adult medical examination without abnormal findings: Secondary | ICD-10-CM | POA: Diagnosis not present

## 2020-03-15 DIAGNOSIS — E559 Vitamin D deficiency, unspecified: Secondary | ICD-10-CM | POA: Diagnosis not present

## 2020-03-15 DIAGNOSIS — L03115 Cellulitis of right lower limb: Secondary | ICD-10-CM | POA: Diagnosis not present

## 2020-03-22 DIAGNOSIS — S80861A Insect bite (nonvenomous), right lower leg, initial encounter: Secondary | ICD-10-CM | POA: Diagnosis not present

## 2020-03-22 DIAGNOSIS — S80862A Insect bite (nonvenomous), left lower leg, initial encounter: Secondary | ICD-10-CM | POA: Diagnosis not present

## 2020-03-31 DIAGNOSIS — R3915 Urgency of urination: Secondary | ICD-10-CM | POA: Diagnosis not present

## 2020-03-31 DIAGNOSIS — Z8546 Personal history of malignant neoplasm of prostate: Secondary | ICD-10-CM | POA: Diagnosis not present

## 2020-03-31 DIAGNOSIS — N5201 Erectile dysfunction due to arterial insufficiency: Secondary | ICD-10-CM | POA: Diagnosis not present

## 2020-04-19 DIAGNOSIS — B356 Tinea cruris: Secondary | ICD-10-CM | POA: Diagnosis not present

## 2020-04-19 DIAGNOSIS — L03114 Cellulitis of left upper limb: Secondary | ICD-10-CM | POA: Diagnosis not present

## 2020-05-01 ENCOUNTER — Inpatient Hospital Stay (HOSPITAL_COMMUNITY): Payer: PPO | Admitting: Anesthesiology

## 2020-05-01 ENCOUNTER — Ambulatory Visit (HOSPITAL_COMMUNITY)
Admission: AD | Admit: 2020-05-01 | Discharge: 2020-05-01 | Disposition: A | Discharge status: Home / Self Care | Payer: PPO | Source: Ambulatory Visit | Attending: Urology | Admitting: Urology

## 2020-05-01 ENCOUNTER — Other Ambulatory Visit: Payer: Self-pay | Admitting: Urology

## 2020-05-01 ENCOUNTER — Encounter (HOSPITAL_COMMUNITY): Payer: Self-pay | Admitting: Urology

## 2020-05-01 ENCOUNTER — Encounter (HOSPITAL_COMMUNITY)
Admission: AD | Disposition: A | Discharge status: Home / Self Care | Payer: Self-pay | Source: Ambulatory Visit | Attending: Urology

## 2020-05-01 DIAGNOSIS — K219 Gastro-esophageal reflux disease without esophagitis: Secondary | ICD-10-CM | POA: Insufficient documentation

## 2020-05-01 DIAGNOSIS — Z79899 Other long term (current) drug therapy: Secondary | ICD-10-CM | POA: Insufficient documentation

## 2020-05-01 DIAGNOSIS — R338 Other retention of urine: Secondary | ICD-10-CM | POA: Diagnosis not present

## 2020-05-01 DIAGNOSIS — N3289 Other specified disorders of bladder: Secondary | ICD-10-CM | POA: Insufficient documentation

## 2020-05-01 DIAGNOSIS — I10 Essential (primary) hypertension: Secondary | ICD-10-CM | POA: Insufficient documentation

## 2020-05-01 DIAGNOSIS — Z87891 Personal history of nicotine dependence: Secondary | ICD-10-CM | POA: Insufficient documentation

## 2020-05-01 DIAGNOSIS — E871 Hypo-osmolality and hyponatremia: Secondary | ICD-10-CM | POA: Diagnosis not present

## 2020-05-01 DIAGNOSIS — N320 Bladder-neck obstruction: Secondary | ICD-10-CM | POA: Diagnosis not present

## 2020-05-01 DIAGNOSIS — F419 Anxiety disorder, unspecified: Secondary | ICD-10-CM | POA: Diagnosis not present

## 2020-05-01 DIAGNOSIS — R3914 Feeling of incomplete bladder emptying: Secondary | ICD-10-CM | POA: Diagnosis not present

## 2020-05-01 DIAGNOSIS — Z7982 Long term (current) use of aspirin: Secondary | ICD-10-CM | POA: Diagnosis not present

## 2020-05-01 DIAGNOSIS — Z8546 Personal history of malignant neoplasm of prostate: Secondary | ICD-10-CM | POA: Insufficient documentation

## 2020-05-01 DIAGNOSIS — Z20822 Contact with and (suspected) exposure to covid-19: Secondary | ICD-10-CM | POA: Insufficient documentation

## 2020-05-01 DIAGNOSIS — N5201 Erectile dysfunction due to arterial insufficiency: Secondary | ICD-10-CM | POA: Diagnosis not present

## 2020-05-01 DIAGNOSIS — R339 Retention of urine, unspecified: Secondary | ICD-10-CM | POA: Diagnosis not present

## 2020-05-01 DIAGNOSIS — N35911 Unspecified urethral stricture, male, meatal: Secondary | ICD-10-CM | POA: Diagnosis not present

## 2020-05-01 HISTORY — PX: CYSTOSCOPY: SHX5120

## 2020-05-01 LAB — BASIC METABOLIC PANEL
Anion gap: 11 (ref 5–15)
BUN: 10 mg/dL (ref 8–23)
CO2: 25 mmol/L (ref 22–32)
Calcium: 9.7 mg/dL (ref 8.9–10.3)
Chloride: 104 mmol/L (ref 98–111)
Creatinine, Ser: 0.89 mg/dL (ref 0.61–1.24)
GFR, Estimated: >60 mL/min (ref >60)
Glucose, Bld: 94 mg/dL (ref 70–99)
Potassium: 4.2 mmol/L (ref 3.5–5.1)
Sodium: 140 mmol/L (ref 135–145)

## 2020-05-01 LAB — CBC
HCT: 45.8 % (ref 39.0–52.0)
Hemoglobin: 15 g/dL (ref 13.0–17.0)
MCH: 31.1 pg (ref 26.0–34.0)
MCHC: 32.8 g/dL (ref 30.0–36.0)
MCV: 94.8 fL (ref 80.0–100.0)
Platelets: 300 10*3/uL (ref 150–400)
RBC: 4.83 MIL/uL (ref 4.22–5.81)
RDW: 13.3 % (ref 11.5–15.5)
WBC: 11.3 10*3/uL — ABNORMAL HIGH (ref 4.0–10.5)
nRBC: 0 % (ref 0.0–0.2)

## 2020-05-01 LAB — RESPIRATORY PANEL BY RT PCR (FLU A&B, COVID)
Influenza A by PCR: NEGATIVE
Influenza B by PCR: NEGATIVE
SARS Coronavirus 2 by RT PCR: NEGATIVE

## 2020-05-01 SURGERY — CYSTOSCOPY
Anesthesia: General | Site: Bladder

## 2020-05-01 MED ORDER — LIDOCAINE 2% (20 MG/ML) 5 ML SYRINGE
INTRAMUSCULAR | Status: DC | PRN
  Administered 2020-05-01: 60 mg via INTRAVENOUS

## 2020-05-01 MED ORDER — 0.9 % SODIUM CHLORIDE (POUR BTL) OPTIME
TOPICAL | Status: DC | PRN
  Administered 2020-05-01: 1000 mL

## 2020-05-01 MED ORDER — TRAMADOL HCL 50 MG PO TABS: 50.0000 mg | ORAL_TABLET | Freq: Four times a day (QID) | ORAL | 0 refills | Status: DC | PRN

## 2020-05-01 MED ORDER — ONDANSETRON HCL 4 MG/2ML IJ SOLN: 4.0000 mg | Freq: Once | INTRAMUSCULAR | Status: DC | PRN

## 2020-05-01 MED ORDER — PROPOFOL 10 MG/ML IV BOLUS
INTRAVENOUS | Status: DC | PRN
  Administered 2020-05-01: 150 mg via INTRAVENOUS

## 2020-05-01 MED ORDER — LACTATED RINGERS IV SOLN: INTRAVENOUS | Status: DC | PRN

## 2020-05-01 MED ORDER — ACETAMINOPHEN 10 MG/ML IV SOLN: 1000.0000 mg | Freq: Once | INTRAVENOUS | Status: DC | PRN

## 2020-05-01 MED ORDER — FENTANYL CITRATE (PF) 100 MCG/2ML IJ SOLN: 25.0000 ug | INTRAMUSCULAR | Status: DC | PRN

## 2020-05-01 MED ORDER — CEPHALEXIN 500 MG PO CAPS: 500.0000 mg | ORAL_CAPSULE | Freq: Three times a day (TID) | ORAL | 0 refills | Status: AC

## 2020-05-01 MED ORDER — SODIUM CHLORIDE 0.9 % IR SOLN
Status: DC | PRN
  Administered 2020-05-01: 3000 mL

## 2020-05-01 MED ORDER — LACTATED RINGERS IV SOLN: Freq: Once | INTRAVENOUS | Status: AC

## 2020-05-01 MED ORDER — FENTANYL CITRATE (PF) 100 MCG/2ML IJ SOLN
INTRAMUSCULAR | Status: AC
  Filled 2020-05-01: qty 2

## 2020-05-01 MED ORDER — CEFAZOLIN SODIUM-DEXTROSE 2-4 GM/100ML-% IV SOLN
INTRAVENOUS | Status: AC
  Filled 2020-05-01: qty 100

## 2020-05-01 MED ORDER — ONDANSETRON HCL 4 MG/2ML IJ SOLN
INTRAMUSCULAR | Status: DC | PRN
  Administered 2020-05-01: 4 mg via INTRAVENOUS

## 2020-05-01 MED ORDER — CHLORHEXIDINE GLUCONATE 0.12 % MT SOLN
15.0000 mL | OROMUCOSAL | Status: AC
  Administered 2020-05-01: 15 mL via OROMUCOSAL

## 2020-05-01 MED ORDER — SUCCINYLCHOLINE CHLORIDE 200 MG/10ML IV SOSY
PREFILLED_SYRINGE | INTRAVENOUS | Status: DC | PRN
  Administered 2020-05-01: 60 mg via INTRAVENOUS

## 2020-05-01 MED ORDER — CEFAZOLIN SODIUM-DEXTROSE 2-4 GM/100ML-% IV SOLN
2.0000 g | INTRAVENOUS | Status: AC
  Administered 2020-05-01: 2 g via INTRAVENOUS

## 2020-05-01 MED ORDER — DEXAMETHASONE SODIUM PHOSPHATE 10 MG/ML IJ SOLN
INTRAMUSCULAR | Status: DC | PRN
  Administered 2020-05-01: 10 mg via INTRAVENOUS

## 2020-05-01 MED ORDER — FENTANYL CITRATE (PF) 100 MCG/2ML IJ SOLN
INTRAMUSCULAR | Status: DC | PRN
Start: 2020-05-01 — End: 2020-05-01
  Administered 2020-05-01: 50 ug via INTRAVENOUS

## 2020-05-01 SURGICAL SUPPLY — 5 items
BAG DRAINAGE 600ML DEPOT (BAG) ×3 IMPLANT
BAG DRN 24 TWST VLV ADJ (BAG) ×1
CATH FOLEY 2W COUNCIL 5CC 16FR (CATHETERS) ×3 IMPLANT
GUIDEWIRE STR DUAL SENSOR (WIRE) ×3 IMPLANT
PACK CYSTO (CUSTOM PROCEDURE TRAY) ×3 IMPLANT

## 2020-05-01 NOTE — Anesthesia Preprocedure Evaluation (Addendum)
Anesthesia Evaluation  Patient identified by MRN, date of birth, ID band Patient awake    Reviewed: Allergy & Precautions, NPO status , Patient's Chart, lab work & pertinent test results  Airway Mallampati: III  TM Distance: >3 FB Neck ROM: Full    Dental  (+) Missing, Poor Dentition   Pulmonary former smoker,    Pulmonary exam normal breath sounds clear to auscultation       Cardiovascular hypertension, Pt. on medications Normal cardiovascular exam Rhythm:Regular Rate:Normal     Neuro/Psych Anxiety negative neurological ROS     GI/Hepatic Neg liver ROS, GERD  Medicated and Controlled,  Endo/Other  negative endocrine ROS  Renal/GU negative Renal ROS   H/O prostate cancer    Musculoskeletal negative musculoskeletal ROS (+)   Abdominal   Peds  Hematology negative hematology ROS (+)   Anesthesia Other Findings Difficult Catheter Placement  Reproductive/Obstetrics                            Anesthesia Physical Anesthesia Plan  ASA: II  Anesthesia Plan: General   Post-op Pain Management:    Induction: Intravenous  PONV Risk Score and Plan: 3 and Ondansetron, Dexamethasone and Treatment may vary due to age or medical condition  Airway Management Planned: Oral ETT  Additional Equipment:   Intra-op Plan:   Post-operative Plan: Extubation in OR  Informed Consent: I have reviewed the patients History and Physical, chart, labs and discussed the procedure including the risks, benefits and alternatives for the proposed anesthesia with the patient or authorized representative who has indicated his/her understanding and acceptance.     Dental advisory given  Plan Discussed with: CRNA  Anesthesia Plan Comments:         Anesthesia Quick Evaluation

## 2020-05-01 NOTE — Anesthesia Procedure Notes (Signed)
Date/Time: 05/01/2020 7:57 PM Performed by: Cynda Familia, CRNA Oxygen Delivery Method: Simple face mask Placement Confirmation: positive ETCO2 and breath sounds checked- equal and bilateral Dental Injury: Teeth and Oropharynx as per pre-operative assessment

## 2020-05-01 NOTE — Progress Notes (Signed)
D/C instructions provided to patient and wife together in PACU. Catheter care and maintenance thoroughly reviewed and demonstrated by RN with proper return demonstrations by both patient and his wife. All questions answered-they verbalized understanding, and stated they feel comfortable and prepared for catheter care at home.  All necessary supplies sent home with patient, including extra catheter drainage bags, leg bag, foley straps, alcohol wipes and other items.

## 2020-05-01 NOTE — Discharge Instructions (Signed)
Indwelling Urinary Catheter Care, Adult °An indwelling urinary catheter is a thin tube that is put into your bladder. The tube helps to drain pee (urine) out of your body. The tube goes in through your urethra. Your urethra is where pee comes out of your body. Your pee will come out through the catheter, then it will go into a bag (drainage bag). °Take good care of your catheter so it will work well. °How to wear your catheter and bag °Supplies needed °· Sticky tape (adhesive tape) or a leg strap. °· Alcohol wipe or soap and water (if you use tape). °· A clean towel (if you use tape). °· Large overnight bag. °· Smaller bag (leg bag). °Wearing your catheter °Attach your catheter to your leg with tape or a leg strap. °· Make sure the catheter is not pulled tight. °· If a leg strap gets wet, take it off and put on a dry strap. °· If you use tape to hold the bag on your leg: °1. Use an alcohol wipe or soap and water to wash your skin where the tape made it sticky before. °2. Use a clean towel to pat-dry that skin. °3. Use new tape to make the bag stay on your leg. °Wearing your bags °You should have been given a large overnight bag. °· You may wear the overnight bag in the day or night. °· Always have the overnight bag lower than your bladder.  Do not let the bag touch the floor. °· Before you go to sleep, put a clean plastic bag in a wastebasket. Then hang the overnight bag inside the wastebasket. °You should also have a smaller leg bag that fits under your clothes. °· Always wear the leg bag below your knee. °· Do not wear your leg bag at night. °How to care for your skin and catheter °Supplies needed °· A clean washcloth. °· Water and mild soap. °· A clean towel. °Caring for your skin and catheter ° °  ° °· Clean the skin around your catheter every day: °1. Wash your hands with soap and water. °2. Wet a clean washcloth in warm water and mild soap. °3. Clean the skin around your urethra. °§ If you are  male: °§ Gently spread the folds of skin around your vagina (labia). °§ With the washcloth in your other hand, wipe the inner side of your labia on each side. Wipe from front to back. °§ If you are male: °§ Pull back any skin that covers the end of your penis (foreskin). °§ With the washcloth in your other hand, wipe your penis in small circles. Start wiping at the tip of your penis, then move away from the catheter. °§ Move the foreskin back in place, if needed. °4. With your free hand, hold the catheter close to where it goes into your body. °§ Keep holding the catheter during cleaning so it does not get pulled out. °5. With the washcloth in your other hand, clean the catheter. °§ Only wipe downward on the catheter. °§ Do not wipe upward toward your body. Doing this may push germs into your urethra and cause infection. °6. Use a clean towel to pat-dry the catheter and the skin around it. Make sure to wipe off all soap. °7. Wash your hands with soap and water. °· Shower every day. Do not take baths. °· Do not use cream, ointment, or lotion on the area where the catheter goes into your body, unless your doctor tells you   to. °· Do not use powders, sprays, or lotions on your genital area. °· Check your skin around the catheter every day for signs of infection. Check for: °? Redness, swelling, or pain. °? Fluid or blood. °? Warmth. °? Pus or a bad smell. °How to empty the bag °Supplies needed °· Rubbing alcohol. °· Gauze pad or cotton ball. °· Tape or a leg strap. °Emptying the bag °Pour the pee out of your bag when it is ?-½ full, or at least 2-3 times a day. Do this for your overnight bag and your leg bag. °1. Wash your hands with soap and water. °2. Separate (detach) the bag from your leg. °3. Hold the bag over the toilet or a clean pail. Keep the bag lower than your hips and bladder. This is so the pee (urine) does not go back into the tube. °4. Open the pour spout. It is at the bottom of the bag. °5. Empty the  pee into the toilet or pail. Do not let the pour spout touch any surface. °6. Put rubbing alcohol on a gauze pad or cotton ball. °7. Use the gauze pad or cotton ball to clean the pour spout. °8. Close the pour spout. °9. Attach the bag to your leg with tape or a leg strap. °10. Wash your hands with soap and water. °Follow instructions for cleaning the drainage bag: °· From the product maker. °· As told by your doctor. °How to change the bag °Supplies needed °· Alcohol wipes. °· A clean bag. °· Tape or a leg strap. °Changing the bag °Replace your bag when it starts to leak, smell bad, or look dirty. °1. Wash your hands with soap and water. °2. Separate the dirty bag from your leg. °3. Pinch the catheter with your fingers so that pee does not spill out. °4. Separate the catheter tube from the bag tube where these tubes connect (at the connection valve). Do not let the tubes touch any surface. °5. Clean the end of the catheter tube with an alcohol wipe. Use a different alcohol wipe to clean the end of the bag tube. °6. Connect the catheter tube to the tube of the clean bag. °7. Attach the clean bag to your leg with tape or a leg strap. Do not make the bag tight on your leg. °8. Wash your hands with soap and water. °General rules ° °· Never pull on your catheter. Never try to take it out. Doing that can hurt you. °· Always wash your hands before and after you touch your catheter or bag. Use a mild, fragrance-free soap. If you do not have soap and water, use hand sanitizer. °· Always make sure there are no twists or bends (kinks) in the catheter tube. °· Always make sure there are no leaks in the catheter or bag. °· Drink enough fluid to keep your pee pale yellow. °· Do not take baths, swim, or use a hot tub. °· If you are male, wipe from front to back after you poop (have a bowel movement). °Contact a doctor if: °· Your pee is cloudy. °· Your pee smells worse than usual. °· Your catheter gets clogged. °· Your catheter  leaks. °· Your bladder feels full. °Get help right away if: °· You have redness, swelling, or pain where the catheter goes into your body. °· You have fluid, blood, pus, or a bad smell coming from the area where the catheter goes into your body. °· Your skin feels warm where   the catheter goes into your body. °· You have a fever. °· You have pain in your: °? Belly (abdomen). °? Legs. °? Lower back. °? Bladder. °· You see blood in the catheter. °· Your pee is pink or red. °· You feel sick to your stomach (nauseous). °· You throw up (vomit). °· You have chills. °· Your pee is not draining into the bag. °· Your catheter gets pulled out. °Summary °· An indwelling urinary catheter is a thin tube that is placed into the bladder to help drain pee (urine) out of the body. °· The catheter is placed into the part of the body that drains pee from the bladder (urethra). °· Taking good care of your catheter will keep it working properly and help prevent problems. °· Always wash your hands before and after touching your catheter or bag. °· Never pull on your catheter or try to take it out. °This information is not intended to replace advice given to you by your health care provider. Make sure you discuss any questions you have with your health care provider. °Document Revised: 10/16/2018 Document Reviewed: 02/07/2017 °Elsevier Patient Education © 2020 [employer] ° ° ° ° °General Anesthesia, Adult, Care After °This sheet gives you information about how to care for yourself after your procedure. Your health care provider may also give you more specific instructions. If you have problems or questions, contact your health care provider. °What can I expect after the procedure? °After the procedure, the following side effects are common: °· Pain or discomfort at the IV site. °· Nausea. °· Vomiting. °· Sore throat. °· Trouble concentrating. °· Feeling cold or chills. °· Weak or tired. °· Sleepiness and fatigue. °· Soreness and body  aches. These side effects can affect parts of the body that were not involved in surgery. °Follow these instructions at home: ° °For at least 24 hours after the procedure: °· Have a responsible adult stay with you. It is important to have someone help care for you until you are awake and alert. °· Rest as needed. °· Do not: °? Participate in activities in which you could fall or become injured. °? Drive. °? Use heavy machinery. °? Drink alcohol. °? Take sleeping pills or medicines that cause drowsiness. °? Make important decisions or sign legal documents. °? Take care of children on your own. °Eating and drinking °· Follow any instructions from your health care provider about eating or drinking restrictions. °· When you feel hungry, start by eating small amounts of foods that are soft and easy to digest (bland), such as toast. Gradually return to your regular diet. °· Drink enough fluid to keep your urine pale yellow. °· If you vomit, rehydrate by drinking water, juice, or clear broth. °General instructions °· If you have sleep apnea, surgery and certain medicines can increase your risk for breathing problems. Follow instructions from your health care provider about wearing your sleep device: °? Anytime you are sleeping, including during daytime naps. °? While taking prescription pain medicines, sleeping medicines, or medicines that make you drowsy. °· Return to your normal activities as told by your health care provider. Ask your health care provider what activities are safe for you. °· Take over-the-counter and prescription medicines only as told by your health care provider. °· If you smoke, do not smoke without supervision. °· Keep all follow-up visits as told by your health care provider. This is important. °Contact a health care provider if: °· You have nausea or vomiting that   does not get better with medicine. °· You cannot eat or drink without vomiting. °· You have pain that does not get better with  medicine. °· You are unable to pass urine. °· You develop a skin rash. °· You have a fever. °· You have redness around your IV site that gets worse. °Get help right away if: °· You have difficulty breathing. °· You have chest pain. °· You have blood in your urine or stool, or you vomit blood. °Summary °· After the procedure, it is common to have a sore throat or nausea. It is also common to feel tired. °· Have a responsible adult stay with you for the first 24 hours after general anesthesia. It is important to have someone help care for you until you are awake and alert. °· When you feel hungry, start by eating small amounts of foods that are soft and easy to digest (bland), such as toast. Gradually return to your regular diet. °· Drink enough fluid to keep your urine pale yellow. °· Return to your normal activities as told by your health care provider. Ask your health care provider what activities are safe for you. °This information is not intended to replace advice given to you by your health care provider. Make sure you discuss any questions you have with your health care provider. °Document Revised: 06/27/2017 Document Reviewed: 02/07/2017 °Elsevier Patient Education © 2020 [employer] ° °

## 2020-05-01 NOTE — H&P (Signed)
86 year old man with a history of prostate cancer who underwent brachytherapy for his cancer about 15 years ago presented to the clinic today with frequent small voids. He states he was urinating every 15 minutes, but each time only a few drops. He felt as if he was not able to empty his bladder completely. He was started on Myrbetriq 1 week ago for urinary frequency. This seems to have made his symptoms worse. He denies any real dysuria or hematuria. He denies any flank pain, suprapubic pain, or nausea vomiting. He denies any fevers or chills.     ALLERGIES: Codeine Derivatives Flomax CP24    MEDICATIONS: Lisinopril  Myrbetriq 25 mg tablet, extended release 24 hr 1 tablet PO Daily  Aspirin 325 MG Oral Tablet Oral  Calcium + D3  Clonazepam  Fish Oil CAPS Oral  Pantoprazole Sodium  Triamcinolone Acetonide  Vitamin C     GU PSH: TRANSPERI NEEDLE PLACE, PROS - 2014 Vasectomy - 2010       [facility] Notes: Cataract Surgery, Surgery Prostate Transperineal Placement Of Needles, Laparoscopy Repair Of Initial Inguinal Hernia, Surgery Of Male Genitalia Vasectomy, Skin Tag Removal   NON-GU PSH: Inguinal hernia repair (laparoscopic) - 2012 Removal of Skin Tags - 2009     GU PMH: ED due to arterial insufficiency - 03/31/2020, Erectile dysfunction due to arterial insufficiency, - 2014 History of prostate cancer - 03/31/2020, (Stable), His prostate was again noted to be smooth and benign with a PSA that remains exceedingly low at 0.01., - 2019 Urinary Urgency - 03/31/2020 Bladder Stone, Bladder calculus - 2014 Spermatocele of epididymis, Unspec, Spermatocele - 2014      PMH Notes: Adenocarcinoma of the prostate: He had a long history of a mildly elevated PSA however in 6/09 it was found to have risen to 7.8 with a free to total ratio of 18.3%. At that time his DRE revealed a subtle apical nodule on the left-hand side. Transrectal ultrasound and biopsy was performed on 01/26/08. That revealed an 83 cc  gland and the pathology revealed adenocarcinoma Gleason 3+4=7 in 7/12 cores ranging from 10-60% involvement of each of the cores.  Treatment: On 06/15/08 he underwent I-125 seed implantation.   LUTS: Even before he had his radioactive seeds implanted he did have urgency with no urge incontinence. I tried him on anticholinergic therapy (Enablex 15 mg, Toviaz 4 mg and VESIcare 5 mg) and then an alpha-blocker. He said the Flomax resulted in significant increase in urinary frequency and he stopped that and has also tried Rapaflo.  Urodynamics 1/13; 158 cc functional capacity, marked detrusor instability with contraction pressure to 124 cc. Minimal PVR.  Treatment: Subjective resolution of symptoms without therapy.    He does have a history of erectile dysfunction. I have initiated treatment with Cialis.     NON-GU PMH: Tinea cruris, He appears to have kidney occurs. I am going to send in a prescription for some clotrimazole. - 2019 Encounter for general adult medical examination without abnormal findings, Encounter for preventive health examination - 2016 Anxiety, Anxiety - 2014    FAMILY HISTORY: Family Health Status Number - Runs In Family Heart Disease - Father, Brother Stroke Syndrome - Mother   SOCIAL HISTORY: No Social History     Notes: Former smoker, Occupation:, Alcohol Use, Previous History Of Smoking, Marital History - Currently Married, Caffeine Use   REVIEW OF SYSTEMS:    GU Review Male:   Patient reports frequent urination, hard to postpone urination, get up at night to  urinate, trouble starting your stream, and have to strain to urinate . Patient denies burning/ pain with urination, leakage of urine, stream starts and stops, erection problems, and penile pain.  Gastrointestinal (Upper):   Patient denies nausea, vomiting, and indigestion/ heartburn.  Gastrointestinal (Lower):   Patient denies diarrhea and constipation.  Constitutional:   Patient denies fever, night sweats, weight  loss, and fatigue.  Skin:   Patient denies skin rash/ lesion and itching.  Eyes:   Patient denies blurred vision and double vision.  Ears/ Nose/ Throat:   Patient denies sore throat and sinus problems.  Hematologic/Lymphatic:   Patient denies swollen glands and easy bruising.  Cardiovascular:   Patient denies leg swelling and chest pains.  Respiratory:   Patient denies cough and shortness of breath.  Endocrine:   Patient denies excessive thirst.  Musculoskeletal:   Patient denies back pain and joint pain.  Neurological:   Patient denies headaches and dizziness.  Psychologic:   Patient reports anxiety. Patient denies depression.   Notes: Pain in testicles.     VITAL SIGNS:      05/01/2020 02:25 PM  BP 198/120 mmHg  Pulse 75 /min  Temperature 98.0 F / 36.6 C   GU PHYSICAL EXAMINATION:    Anus and Perineum: No hemorrhoids. No anal stenosis. No rectal fissure, no anal fissure. No edema, no dimple, no perineal tenderness, no anal tenderness.  Scrotum: No lesions. No edema. No cysts. No warts.  Epididymides: Right: no spermatocele, no masses, no cysts, no tenderness, no induration, no enlargement. Left: no spermatocele, no masses, no cysts, no tenderness, no induration, no enlargement.  Testes: No tenderness, no swelling, no enlargement left testes. No tenderness, no swelling, no enlargement right testes. Normal location left testes. Normal location right testes. No mass, no cyst, no varicocele, no hydrocele left testes. No mass, no cyst, no varicocele, no hydrocele right testes.  Urethral Meatus: Normal size. No lesion, no wart, no discharge, no polyp. Normal location.  Penis: Circumcised, no warts, no cracks. No dorsal Peyronie's plaques, no left corporal Peyronie's plaques, no right corporal Peyronie's plaques, no scarring, no warts. No balanitis, no meatal stenosis.  Prostate: 40 gram or 2+ size. Left lobe normal consistency, right lobe normal consistency. Symmetrical lobes. No prostate  nodule. Left lobe no tenderness, right lobe no tenderness.  Seminal Vesicles: Nonpalpable.  Sphincter Tone: Normal sphincter. No rectal tenderness. No rectal mass.    MULTI-SYSTEM PHYSICAL EXAMINATION:    Constitutional: Well-nourished. No physical deformities. Normally developed. Good grooming.  Neck: Neck symmetrical, not swollen. Normal tracheal position.  Respiratory: No labored breathing, no use of accessory muscles.   Cardiovascular: Normal temperature, normal extremity pulses, no swelling, no varicosities.  Lymphatic: No enlargement of neck, axillae, groin.  Skin: No paleness, no jaundice, no cyanosis. No lesion, no ulcer, no rash.  Neurologic / Psychiatric: Oriented to time, oriented to place, oriented to person. No depression, no anxiety, no agitation.  Gastrointestinal: No mass, no tenderness, no rigidity, non obese abdomen.  Eyes: Normal conjunctivae. Normal eyelids.  Ears, Nose, Mouth, and Throat: Left ear no scars, no lesions, no masses. Right ear no scars, no lesions, no masses. Nose no scars, no lesions, no masses. Normal hearing. Normal lips.  Musculoskeletal: Normal gait and station of head and neck.     Complexity of Data:   03/31/20 11/08/16 09/07/14 08/30/13 08/25/12 02/12/12 04/27/11 10/25/10  PSA  Total PSA <0.015 ng/mL 0.01 ng/dl 0.01  0.02  0.02  0.02  0.03  0.01  PROCEDURES:         Flexible Cystoscopy - 52000  Risks, benefits, and some of the potential complications of the procedure were discussed at length with the patient including infection, bleeding, voiding discomfort, urinary retention, fever, chills, sepsis, and others. All questions were answered. Informed consent was obtained. Antibiotic prophylaxis was given. Sterile technique and intraurethral analgesia were used.  Meatus:  Normal size. Normal location. Normal condition.  Urethra:  No strictures.  External Sphincter:  Normal.  Verumontanum:  Normal.  Prostate:  dystrophic calcification  obstructing lumen of urethra. able to get wire passed, but unable to advance a catheter over the wire.      The lower urinary tract was carefully examined. The procedure was well-tolerated and without complications. Antibiotic instructions were given. Instructions were given to call the office immediately for bloody urine, difficulty urinating, urinary retention, painful or frequent urination, fever, chills, nausea, vomiting or other illness. The patient stated that he understood these instructions and would comply with them.  Procedure performed by Dr. Jearl Klinefelter        PVR Ultrasound - 16109  Scanned Volume: 301 cc   ASSESSMENT:      ICD-10 Details  1 GU:   Incomplete bladder emptying - R39.14   2   History of prostate cancer - Z85.46    PLAN:           Document Letter(s):  Created for Patient: Clinical Summary         Notes:   After several failed attempts at passing a Foley catheter including attempted placement of a catheter over a wire via cystoscopy, Dr. Louis Meckel is opted to take this patient to the operating room for placement of a Foley catheter.

## 2020-05-01 NOTE — Interval H&P Note (Signed)
History and Physical Interval Note:  05/01/2020 7:19 PM  Glen Henry  has presented today for surgery, with the diagnosis of Difficult Catheter Placement.  The various methods of treatment have been discussed with the patient and family. After consideration of risks, benefits and other options for treatment, the patient has consented to  Procedure(s): CYSTOSCOPY (N/A) as a surgical intervention.  [name] history has been reviewed, patient examined, no change in status, stable for surgery.  I have reviewed the patient's chart and labs.  Questions were answered to the patient's satisfaction.     Ardis Hughs

## 2020-05-01 NOTE — Anesthesia Procedure Notes (Signed)
Procedure Name: Intubation Date/Time: 05/01/2020 7:38 PM Performed by: Cynda Familia, CRNA Pre-anesthesia Checklist: Patient identified, Emergency Drugs available, Suction available and Patient being monitored Patient Re-evaluated:Patient Re-evaluated prior to induction Oxygen Delivery Method: Circle System Utilized Preoxygenation: Pre-oxygenation with 100% oxygen Induction Type: IV induction, Cricoid Pressure applied and Rapid sequence Ventilation: Mask ventilation without difficulty Laryngoscope Size: Miller and 2 Grade View: Grade I Tube type: Oral Number of attempts: 1 Airway Equipment and Method: Stylet and Oral airway Placement Confirmation: ETT inserted through vocal cords under direct vision,  positive ETCO2 and breath sounds checked- equal and bilateral Secured at: 22 cm Tube secured with: Tape Dental Injury: Teeth and Oropharynx as per pre-operative assessment  Comments: Smooth RSI  By Ellender-- intubation by AM CRNA atraumatic-- teeth and mouth as preop- very poor dentition missing teeth upper and lower with few remaining that are all broken and chipped-- unchanged with laryngoscopy-- bilat BS Ellender

## 2020-05-01 NOTE — Transfer of Care (Signed)
Immediate Anesthesia Transfer of Care Note  Patient: Glen Henry  Procedure(s) Performed: CYSTOSCOPY catheter placement (N/A Bladder)  Patient Location: PACU  Anesthesia Type:General  Level of Consciousness: sedated  Airway & Oxygen Therapy: Patient Spontanous Breathing and Patient connected to face mask oxygen  Post-op Assessment: Report given to RN and Post -op Vital signs reviewed and stable  Post vital signs: Reviewed and stable  Last Vitals:  Vitals Value Taken Time  BP 145/76 05/01/20 2006  Temp    Pulse 75 05/01/20 2010  Resp 20 05/01/20 2010  SpO2 100 % 05/01/20 2010  Vitals shown include unvalidated device data.  Last Pain:  Vitals:   05/01/20 2006  TempSrc:   PainSc: Asleep         Complications: No complications documented.

## 2020-05-01 NOTE — Op Note (Signed)
Preoperative diagnosis:  1. Urinary retention  Postoperative diagnosis:  1. Same  Procedure: 1. Cystoscopy, difficult Foley insertion  Surgeon: Ardis Hughs, MD  Anesthesia: General  Complications: None  Intraoperative findings: The patient had a large amount of dystrophic calcification on the right lateral lobe of the prostate that was obstructing the bladder neck and posterior urethra.  Once I was able to advance the scope beyond this area I did find the bladder neck that was stenotic.  I was able to advance through this region and found the bladder that was trabeculated heavily.  There were no other significant abnormalities of the bladder.  EBL: Minimal  Specimens: None  Indication: Glen Henry is a 86 y.o. patient with urinary retention whom we were unable to get the catheter in clinic.Marland Kitchen  After reviewing the management options for treatment, he elected to proceed with the above surgical procedure(s). We have discussed the potential benefits and risks of the procedure, side effects of the proposed treatment, the likelihood of the patient achieving the goals of the procedure, and any potential problems that might occur during the procedure or recuperation. Informed consent has been obtained.  Description of procedure:  The patient was taken to the operating room and general anesthesia was induced.  The patient was placed in the dorsal lithotomy position, prepped and draped in the usual sterile fashion, and preoperative antibiotics were administered. A preoperative time-out was performed.   21 French 30 degree cystoscope was gently passed through the patient's urethra and into the prostatic urethra.  There I was able to advance past the bladder neck after manipulating the dystrophic calcifications on the right lateral lobe of the prostate.  Once inside the bladder I was able to perform cystoscopy.  The above findings were noted.  I advanced the sensor wire through the scope and  left the tip of the wire in the bladder.  I then removed the scope over the wire.  I subsequently advanced an 32 [place] council tip catheter over the wire and into the bladder.  Clear urine returned in the wire was removed.  I inflated the balloon with 10 cc of sterile water.  The catheter was then placed to straight drainage the patient was extubated return to PACU in stable condition.  Ardis Hughs, M.D.

## 2020-05-02 ENCOUNTER — Encounter (HOSPITAL_COMMUNITY): Payer: Self-pay | Admitting: Urology

## 2020-05-02 NOTE — Anesthesia Postprocedure Evaluation (Signed)
Anesthesia Post Note  Patient: Glen Henry  Procedure(s) Performed: CYSTOSCOPY catheter placement (N/A Bladder)     Patient location during evaluation: PACU Anesthesia Type: General Level of consciousness: awake Pain management: pain level controlled Vital Signs Assessment: post-procedure vital signs reviewed and stable Respiratory status: spontaneous breathing, nonlabored ventilation, respiratory function stable and patient connected to nasal cannula oxygen Cardiovascular status: blood pressure returned to baseline and stable Postop Assessment: no apparent nausea or vomiting Anesthetic complications: no   No complications documented.  Last Vitals:  Vitals:   05/01/20 2130 05/01/20 2220  BP:  (!) 146/71  Pulse: 73 71  Resp:  18  Temp:  36.5 C  SpO2: 99% 100%    Last Pain:  Vitals:   05/01/20 2220  TempSrc:   PainSc: 0-No pain                 Tayari Yankee P Francis Yardley

## 2020-05-08 DIAGNOSIS — R338 Other retention of urine: Secondary | ICD-10-CM | POA: Diagnosis not present

## 2020-05-15 DIAGNOSIS — R338 Other retention of urine: Secondary | ICD-10-CM | POA: Diagnosis not present

## 2020-05-15 DIAGNOSIS — Z8546 Personal history of malignant neoplasm of prostate: Secondary | ICD-10-CM | POA: Diagnosis not present

## 2020-05-15 DIAGNOSIS — N210 Calculus in bladder: Secondary | ICD-10-CM | POA: Diagnosis not present

## 2020-05-15 DIAGNOSIS — R3914 Feeling of incomplete bladder emptying: Secondary | ICD-10-CM | POA: Diagnosis not present

## 2020-05-18 DIAGNOSIS — Z8546 Personal history of malignant neoplasm of prostate: Secondary | ICD-10-CM | POA: Diagnosis not present

## 2020-05-18 DIAGNOSIS — N210 Calculus in bladder: Secondary | ICD-10-CM | POA: Diagnosis not present

## 2020-05-18 DIAGNOSIS — R3914 Feeling of incomplete bladder emptying: Secondary | ICD-10-CM | POA: Diagnosis not present

## 2020-05-19 ENCOUNTER — Other Ambulatory Visit: Payer: Self-pay | Admitting: Urology

## 2020-05-24 NOTE — Patient Instructions (Addendum)
DUE TO COVID-19 ONLY ONE VISITOR IS ALLOWED TO COME WITH YOU AND STAY IN THE WAITING ROOM ONLY DURING PRE OP AND PROCEDURE DAY OF SURGERY. THE 1 VISITOR  MAY VISIT WITH YOU AFTER SURGERY IN YOUR PRIVATE ROOM DURING VISITING HOURS ONLY!  YOU NEED TO HAVE A COVID 19 TEST ON: 06/03/20 @ 11:00 AM, THIS TEST MUST BE DONE BEFORE SURGERY,  COVID TESTING SITE [place] [address], IT IS ON THE RIGHT GOING OUT [address] APPROXIMATELY  2 MINUTES PAST [employer] ON THE RIGHT. ONCE YOUR COVID TEST IS COMPLETED,  PLEASE BEGIN THE QUARANTINE INSTRUCTIONS AS OUTLINED IN YOUR HANDOUT.                HAROL SHABAZZ    Your procedure is scheduled on: 06/07/20   Report to [facility] Main  Entrance   Report to admitting at: 6:30 AM     Call this number if you have problems the morning of surgery [phone]    Remember: Do not eat food or drink liquids :After Midnight.   BRUSH YOUR TEETH MORNING OF SURGERY AND RINSE YOUR MOUTH OUT, NO CHEWING GUM CANDY OR MINTS.    Take these medicines the morning of surgery with A SIP OF WATER: pantoprazole.                                You may not have any metal on your body including hair pins and              piercings  Do not wear jewelry, lotions, powders or perfumes, deodorant             Men may shave face and neck.   Do not bring valuables to the hospital. [place].  Contacts, dentures or bridgework may not be worn into surgery.  Leave suitcase in the car. After surgery it may be brought to your room.     Patients discharged the day of surgery will not be allowed to drive home. IF YOU ARE HAVING SURGERY AND GOING HOME THE SAME DAY, YOU MUST HAVE AN ADULT TO DRIVE YOU HOME AND BE WITH YOU FOR 24 HOURS. YOU MAY GO HOME BY TAXI OR UBER OR ORTHERWISE, BUT AN ADULT MUST ACCOMPANY YOU HOME AND STAY WITH YOU FOR 24 HOURS.  Name and phone number of your  driver:  Special Instructions: N/A              Please read over the following fact sheets you were given: _____________________________________________________________________         [facility] - Preparing for Surgery Before surgery, you can play an important role.  Because skin is not sterile, your skin needs to be as free of germs as possible.  You can reduce the number of germs on your skin by washing with CHG (chlorahexidine gluconate) soap before surgery.  CHG is an antiseptic cleaner which kills germs and bonds with the skin to continue killing germs even after washing. Please DO NOT use if you have an allergy to CHG or antibacterial soaps.  If your skin becomes reddened/irritated stop using the CHG and inform your nurse when you arrive at Short Stay. Do not shave (including legs and underarms) for at least 48 hours prior to the first  CHG shower.  You may shave your face/neck. Please follow these instructions carefully:  1.  Shower with CHG Soap the night before surgery and the  morning of Surgery.  2.  If you choose to wash your hair, wash your hair first as usual with your  normal  shampoo.  3.  After you shampoo, rinse your hair and body thoroughly to remove the  shampoo.                           4.  Use CHG as you would any other liquid soap.  You can apply chg directly  to the skin and wash                       Gently with a scrungie or clean washcloth.  5.  Apply the CHG Soap to your body ONLY FROM THE NECK DOWN.   Do not use on face/ open                           Wound or open sores. Avoid contact with eyes, ears mouth and genitals (private parts).                       Wash face,  Genitals (private parts) with your normal soap.             6.  Wash thoroughly, paying special attention to the area where your surgery  will be performed.  7.  Thoroughly rinse your body with warm water from the neck down.  8.  DO NOT shower/wash with your normal soap after using and rinsing off   the CHG Soap.                9.  Pat yourself dry with a clean towel.            10.  Wear clean pajamas.            11.  Place clean sheets on your bed the night of your first shower and do not  sleep with pets. Day of Surgery : Do not apply any lotions/deodorants the morning of surgery.  Please wear clean clothes to the hospital/surgery center.  FAILURE TO FOLLOW THESE INSTRUCTIONS MAY RESULT IN THE CANCELLATION OF YOUR SURGERY PATIENT SIGNATURE_________________________________  NURSE SIGNATURE__________________________________  ________________________________________________________________________

## 2020-05-25 ENCOUNTER — Encounter (HOSPITAL_COMMUNITY)
Admission: RE | Admit: 2020-05-25 | Discharge: 2020-05-25 | Disposition: A | Discharge status: Home / Self Care | Payer: PPO | Source: Ambulatory Visit | Attending: Urology | Admitting: Urology

## 2020-05-25 ENCOUNTER — Other Ambulatory Visit: Payer: Self-pay

## 2020-05-25 ENCOUNTER — Encounter (HOSPITAL_COMMUNITY): Payer: Self-pay

## 2020-05-25 DIAGNOSIS — Z01818 Encounter for other preprocedural examination: Secondary | ICD-10-CM | POA: Diagnosis not present

## 2020-05-25 HISTORY — DX: Unspecified osteoarthritis, unspecified site: M19.90

## 2020-05-25 LAB — BASIC METABOLIC PANEL
Anion gap: 12 (ref 5–15)
BUN: 16 mg/dL (ref 8–23)
CO2: 25 mmol/L (ref 22–32)
Calcium: 9.9 mg/dL (ref 8.9–10.3)
Chloride: 101 mmol/L (ref 98–111)
Creatinine, Ser: 0.81 mg/dL (ref 0.61–1.24)
GFR, Estimated: >60 mL/min (ref >60)
Glucose, Bld: 103 mg/dL — ABNORMAL HIGH (ref 70–99)
Potassium: 4.1 mmol/L (ref 3.5–5.1)
Sodium: 138 mmol/L (ref 135–145)

## 2020-05-25 LAB — CBC
HCT: 45.1 % (ref 39.0–52.0)
Hemoglobin: 15 g/dL (ref 13.0–17.0)
MCH: 30.8 pg (ref 26.0–34.0)
MCHC: 33.3 g/dL (ref 30.0–36.0)
MCV: 92.6 fL (ref 80.0–100.0)
Platelets: 335 10*3/uL (ref 150–400)
RBC: 4.87 MIL/uL (ref 4.22–5.81)
RDW: 13.1 % (ref 11.5–15.5)
WBC: 8.7 10*3/uL (ref 4.0–10.5)
nRBC: 0 % (ref 0.0–0.2)

## 2020-05-25 NOTE — Progress Notes (Signed)
COVID Vaccine Completed:Yes Date COVID Vaccine completed: 01/2020. [place] COVID vaccine manufacturer: Pfizer     PCP - Dr. Mayra Neer Cardiologist -   Chest x-ray -  EKG -  Stress Test -  ECHO -  Cardiac Cath -  Pacemaker/ICD device last checked:  Sleep Study -  CPAP -   Fasting Blood Sugar -  Checks Blood Sugar _____ times a day  Blood Thinner Instructions: Aspirin Instructions: Aspirin 325 on hold since 05/24/20. As per surgeon's instructions. Last Dose:  Anesthesia review: Hx: HTN  Patient denies shortness of breath, fever, cough and chest pain at PAT appointment   Patient verbalized understanding of instructions that were given to them at the PAT appointment. Patient was also instructed that they will need to review over the PAT instructions again at home before surgery.

## 2020-06-03 ENCOUNTER — Other Ambulatory Visit (HOSPITAL_COMMUNITY)
Admission: RE | Admit: 2020-06-03 | Discharge: 2020-06-03 | Disposition: A | Discharge status: Home / Self Care | Payer: PPO | Source: Ambulatory Visit | Attending: Urology | Admitting: Urology

## 2020-06-03 DIAGNOSIS — Z01812 Encounter for preprocedural laboratory examination: Secondary | ICD-10-CM | POA: Diagnosis not present

## 2020-06-03 DIAGNOSIS — Z20822 Contact with and (suspected) exposure to covid-19: Secondary | ICD-10-CM | POA: Diagnosis not present

## 2020-06-04 LAB — SARS CORONAVIRUS 2 (TAT 6-24 HRS): SARS Coronavirus 2: NEGATIVE

## 2020-06-06 NOTE — Anesthesia Preprocedure Evaluation (Addendum)
Anesthesia Evaluation  Patient identified by MRN, date of birth, ID band Patient awake    Reviewed: Allergy & Precautions, NPO status , Patient's Chart, lab work & pertinent test results  History of Anesthesia Complications Negative for: history of anesthetic complications  Airway Mallampati: II  TM Distance: >3 FB Neck ROM: Full    Dental  (+) Dental Advisory Given, Chipped   Pulmonary former smoker,    Pulmonary exam normal        Cardiovascular hypertension, Normal cardiovascular exam     Neuro/Psych PSYCHIATRIC DISORDERS Anxiety negative neurological ROS     GI/Hepatic Neg liver ROS, GERD  Medicated and Controlled,  Endo/Other  negative endocrine ROS  Renal/GU negative Renal ROS    Prostate cancer     Musculoskeletal  (+) Arthritis ,   Abdominal   Peds  Hematology negative hematology ROS (+)   Anesthesia Other Findings Covid test negative   Reproductive/Obstetrics                            Anesthesia Physical Anesthesia Plan  ASA: III  Anesthesia Plan: General   Post-op Pain Management:    Induction: Intravenous  PONV Risk Score and Plan: 2 and Treatment may vary due to age or medical condition, Ondansetron and Propofol infusion  Airway Management Planned: LMA  Additional Equipment: None  Intra-op Plan:   Post-operative Plan: Extubation in OR  Informed Consent: I have reviewed the patients History and Physical, chart, labs and discussed the procedure including the risks, benefits and alternatives for the proposed anesthesia with the patient or authorized representative who has indicated his/her understanding and acceptance.     Dental advisory given  Plan Discussed with: CRNA and Anesthesiologist  Anesthesia Plan Comments:        Anesthesia Quick Evaluation

## 2020-06-07 ENCOUNTER — Ambulatory Visit (HOSPITAL_COMMUNITY)
Admission: RE | Admit: 2020-06-07 | Discharge: 2020-06-07 | Disposition: A | Discharge status: Home / Self Care | Payer: PPO | Attending: Urology | Admitting: Urology

## 2020-06-07 ENCOUNTER — Other Ambulatory Visit: Payer: Self-pay

## 2020-06-07 ENCOUNTER — Encounter (HOSPITAL_COMMUNITY)
Admission: RE | Disposition: A | Discharge status: Home / Self Care | Payer: Self-pay | Source: Home / Self Care | Attending: Urology

## 2020-06-07 ENCOUNTER — Encounter (HOSPITAL_COMMUNITY): Payer: Self-pay | Admitting: Urology

## 2020-06-07 ENCOUNTER — Ambulatory Visit (HOSPITAL_COMMUNITY): Payer: PPO | Admitting: Certified Registered Nurse Anesthetist

## 2020-06-07 DIAGNOSIS — Z885 Allergy status to narcotic agent status: Secondary | ICD-10-CM | POA: Insufficient documentation

## 2020-06-07 DIAGNOSIS — N429 Disorder of prostate, unspecified: Secondary | ICD-10-CM | POA: Insufficient documentation

## 2020-06-07 DIAGNOSIS — R339 Retention of urine, unspecified: Secondary | ICD-10-CM | POA: Diagnosis not present

## 2020-06-07 DIAGNOSIS — R3912 Poor urinary stream: Secondary | ICD-10-CM | POA: Diagnosis not present

## 2020-06-07 DIAGNOSIS — R3915 Urgency of urination: Secondary | ICD-10-CM | POA: Diagnosis not present

## 2020-06-07 DIAGNOSIS — N420 Calculus of prostate: Secondary | ICD-10-CM | POA: Diagnosis not present

## 2020-06-07 DIAGNOSIS — N320 Bladder-neck obstruction: Secondary | ICD-10-CM | POA: Diagnosis not present

## 2020-06-07 DIAGNOSIS — Z7982 Long term (current) use of aspirin: Secondary | ICD-10-CM | POA: Insufficient documentation

## 2020-06-07 DIAGNOSIS — Z8546 Personal history of malignant neoplasm of prostate: Secondary | ICD-10-CM | POA: Insufficient documentation

## 2020-06-07 DIAGNOSIS — R338 Other retention of urine: Secondary | ICD-10-CM | POA: Diagnosis not present

## 2020-06-07 DIAGNOSIS — Z888 Allergy status to other drugs, medicaments and biological substances status: Secondary | ICD-10-CM | POA: Diagnosis not present

## 2020-06-07 DIAGNOSIS — Z79899 Other long term (current) drug therapy: Secondary | ICD-10-CM | POA: Diagnosis not present

## 2020-06-07 DIAGNOSIS — N210 Calculus in bladder: Secondary | ICD-10-CM | POA: Insufficient documentation

## 2020-06-07 DIAGNOSIS — N4289 Other specified disorders of prostate: Secondary | ICD-10-CM | POA: Diagnosis not present

## 2020-06-07 HISTORY — PX: TRANSURETHRAL RESECTION OF PROSTATE: SHX73

## 2020-06-07 SURGERY — TURP (TRANSURETHRAL RESECTION OF PROSTATE)
Anesthesia: General

## 2020-06-07 MED ORDER — OXYCODONE HCL 5 MG/5ML PO SOLN: 5.0000 mg | Freq: Once | ORAL | Status: DC | PRN

## 2020-06-07 MED ORDER — LIDOCAINE HCL URETHRAL/MUCOSAL 2 % EX GEL
CUTANEOUS | Status: AC
  Filled 2020-06-07: qty 30

## 2020-06-07 MED ORDER — LIDOCAINE HCL (PF) 2 % IJ SOLN
INTRAMUSCULAR | Status: AC
  Filled 2020-06-07: qty 5

## 2020-06-07 MED ORDER — PROPOFOL 10 MG/ML IV BOLUS
INTRAVENOUS | Status: AC
  Filled 2020-06-07: qty 20

## 2020-06-07 MED ORDER — ONDANSETRON HCL 4 MG/2ML IJ SOLN: 4.0000 mg | Freq: Once | INTRAMUSCULAR | Status: DC | PRN

## 2020-06-07 MED ORDER — BELLADONNA ALKALOIDS-OPIUM 16.2-30 MG RE SUPP
RECTAL | Status: AC
  Filled 2020-06-07: qty 1

## 2020-06-07 MED ORDER — CEFAZOLIN SODIUM-DEXTROSE 2-4 GM/100ML-% IV SOLN
2.0000 g | INTRAVENOUS | Status: AC
  Administered 2020-06-07: 2 g via INTRAVENOUS
  Filled 2020-06-07: qty 100

## 2020-06-07 MED ORDER — FENTANYL CITRATE (PF) 100 MCG/2ML IJ SOLN: 25.0000 ug | INTRAMUSCULAR | Status: DC | PRN

## 2020-06-07 MED ORDER — LACTATED RINGERS IV SOLN: INTRAVENOUS | Status: DC

## 2020-06-07 MED ORDER — EPHEDRINE SULFATE-NACL 50-0.9 MG/10ML-% IV SOSY
PREFILLED_SYRINGE | INTRAVENOUS | Status: DC | PRN
  Administered 2020-06-07 (×2): 10 mg via INTRAVENOUS

## 2020-06-07 MED ORDER — DEXAMETHASONE SODIUM PHOSPHATE 10 MG/ML IJ SOLN
INTRAMUSCULAR | Status: DC | PRN
  Administered 2020-06-07: 7 mg via INTRAVENOUS

## 2020-06-07 MED ORDER — LIDOCAINE 2% (20 MG/ML) 5 ML SYRINGE
INTRAMUSCULAR | Status: DC | PRN
  Administered 2020-06-07: 60 mg via INTRAVENOUS

## 2020-06-07 MED ORDER — BELLADONNA ALKALOIDS-OPIUM 16.2-60 MG RE SUPP
RECTAL | Status: DC | PRN
  Administered 2020-06-07: 1 via RECTAL

## 2020-06-07 MED ORDER — ORAL CARE MOUTH RINSE: 15.0000 mL | Freq: Once | OROMUCOSAL | Status: AC

## 2020-06-07 MED ORDER — ONDANSETRON HCL 4 MG/2ML IJ SOLN
INTRAMUSCULAR | Status: AC
  Filled 2020-06-07: qty 2

## 2020-06-07 MED ORDER — CHLORHEXIDINE GLUCONATE 0.12 % MT SOLN
15.0000 mL | Freq: Once | OROMUCOSAL | Status: AC
  Administered 2020-06-07: 15 mL via OROMUCOSAL

## 2020-06-07 MED ORDER — PROPOFOL 10 MG/ML IV BOLUS
INTRAVENOUS | Status: DC | PRN
  Administered 2020-06-07: 130 mg via INTRAVENOUS

## 2020-06-07 MED ORDER — FENTANYL CITRATE (PF) 100 MCG/2ML IJ SOLN
INTRAMUSCULAR | Status: AC
  Filled 2020-06-07: qty 2

## 2020-06-07 MED ORDER — FENTANYL CITRATE (PF) 100 MCG/2ML IJ SOLN
INTRAMUSCULAR | Status: DC | PRN
  Administered 2020-06-07 (×5): 25 ug via INTRAVENOUS

## 2020-06-07 MED ORDER — OXYCODONE HCL 5 MG PO TABS: 5.0000 mg | ORAL_TABLET | Freq: Once | ORAL | Status: DC | PRN

## 2020-06-07 MED ORDER — DEXAMETHASONE SODIUM PHOSPHATE 10 MG/ML IJ SOLN
INTRAMUSCULAR | Status: AC
  Filled 2020-06-07: qty 1

## 2020-06-07 MED ORDER — ONDANSETRON HCL 4 MG/2ML IJ SOLN
INTRAMUSCULAR | Status: DC | PRN
  Administered 2020-06-07: 4 mg via INTRAVENOUS

## 2020-06-07 SURGICAL SUPPLY — 24 items
BAG URINE DRAIN 2000ML AR STRL (UROLOGICAL SUPPLIES) ×3 IMPLANT
BAG URO CATCHER STRL LF (MISCELLANEOUS) ×3 IMPLANT
CATH FOLEY 2WAY SLVR  5CC 20FR (CATHETERS) ×3
CATH FOLEY 2WAY SLVR 5CC 20FR (CATHETERS) ×1 IMPLANT
CATH FOLEY 3WAY 30CC 22FR (CATHETERS) IMPLANT
CATH FOLEY 3WAY 30CC 24FR (CATHETERS)
CATH INTERMIT  6FR 70CM (CATHETERS) ×3 IMPLANT
CATH URTH STD 24FR FL 3W 2 (CATHETERS) IMPLANT
ELECT REM PT RETURN 15FT ADLT (MISCELLANEOUS) ×3 IMPLANT
GLOVE BIO SURGEON STRL SZ7.5 (GLOVE) ×3 IMPLANT
GOWN STRL REUS W/TWL XL LVL3 (GOWN DISPOSABLE) ×3 IMPLANT
HOLDER FOLEY CATH W/STRAP (MISCELLANEOUS) ×3 IMPLANT
IV NS IRRIG 3000ML ARTHROMATIC (IV SOLUTION) ×12 IMPLANT
KIT TURNOVER KIT A (KITS) IMPLANT
LASER FIB FLEXIVA PULSE ID 910 (Laser) ×3 IMPLANT
LOOP CUT BIPOLAR 24F LRG (ELECTROSURGICAL) IMPLANT
MANIFOLD NEPTUNE II (INSTRUMENTS) ×3 IMPLANT
PACK CYSTO (CUSTOM PROCEDURE TRAY) ×3 IMPLANT
SYR 30ML LL (SYRINGE) IMPLANT
SYR TOOMEY IRRIG 70ML (MISCELLANEOUS) ×3
SYRINGE TOOMEY IRRIG 70ML (MISCELLANEOUS) ×1 IMPLANT
TUBING CONNECTING 10 (TUBING) ×2 IMPLANT
TUBING CONNECTING 10' (TUBING) ×1
TUBING UROLOGY SET (TUBING) ×3 IMPLANT

## 2020-06-07 NOTE — Anesthesia Postprocedure Evaluation (Signed)
Anesthesia Post Note  Patient: Glen Henry  Procedure(s) Performed: LASER TRANSURETHRAL RESECTION OF THE PROSTATE (TURP) HOLMIUM (N/A )     Patient location during evaluation: PACU Anesthesia Type: General Level of consciousness: awake and alert Pain management: pain level controlled Vital Signs Assessment: post-procedure vital signs reviewed and stable Respiratory status: spontaneous breathing, nonlabored ventilation and respiratory function stable Cardiovascular status: blood pressure returned to baseline and stable Postop Assessment: no apparent nausea or vomiting Anesthetic complications: no   No complications documented.  Last Vitals:  Vitals:   06/07/20 1215 06/07/20 1230  BP: 139/72 (!) 148/65  Pulse: 66 63  Resp: 13 11  Temp:  36.5 C  SpO2: 100% 94%    Last Pain:  Vitals:   06/07/20 1230  TempSrc:   PainSc: 0-No pain                 Audry Pili

## 2020-06-07 NOTE — Transfer of Care (Signed)
Immediate Anesthesia Transfer of Care Note  Patient: Glen Henry  Procedure(s) Performed: LASER TRANSURETHRAL RESECTION OF THE PROSTATE (TURP) HOLMIUM (N/A )  Patient Location: PACU  Anesthesia Type:General  Level of Consciousness: drowsy and patient cooperative  Airway & Oxygen Therapy: Patient Spontanous Breathing and Patient connected to face mask oxygen  Post-op Assessment: Report given to RN and Post -op Vital signs reviewed and stable  Post vital signs: Reviewed and stable  Last Vitals:  Vitals Value Taken Time  BP 152/77 06/07/20 1201  Temp    Pulse 62 06/07/20 1203  Resp    SpO2 100 % 06/07/20 1203  Vitals shown include unvalidated device data.  Last Pain:  Vitals:   06/07/20 0714  TempSrc: Oral  PainSc:       Patients Stated Pain Goal: 4 (09/81/19 1478)  Complications: No complications documented.

## 2020-06-07 NOTE — Op Note (Signed)
Preoperative diagnosis:  1. Urinary retention 2. Dystrophic prostatic calcifications  Postoperative diagnosis:  1. Same  Procedure: 1. Transurethral holmium laser ablation of the prostate  Surgeon: Ardis Hughs, MD  Anesthesia: General  Complications: None  Intraoperative findings: The patient had a bladder neck contracture and dense calcifications along the right lateral lobe of his prostate.  EBL: Minimal  Specimens: None  Indication: Glen Henry is a 86 y.o. patient with history of prostate cancer that was treated with brachytherapy.  Over the course of the last several years he has developed urinary urgency and frequency and weak stream.  A month ago the patient subsequently developed the inability to urinate and in clinic a catheter was placed over a wire.  It was noted at that time that he had dense calcifications.  After reviewing the management options for treatment, he elected to proceed with the above surgical procedure(s). We have discussed the potential benefits and risks of the procedure, side effects of the proposed treatment, the likelihood of the patient achieving the goals of the procedure, and any potential problems that might occur during the procedure or recuperation. Informed consent has been obtained.  Description of procedure:  The patient was taken to the operating room and general anesthesia was induced.  The patient was placed in the dorsal lithotomy position, prepped and draped in the usual sterile fashion, and preoperative antibiotics were administered. A preoperative time-out was performed.   30 degree 21 French rigid cystoscope was gently passed through the patient's urethra and into the prostatic urethra under visual guidance.  I was unable to advance the scope into the patient's bladder, but pulled the scope back to the membranous urethra and using a 1000 m laser fiber ablated the bladder neck at the 5 and 7 o'clock position opening up the  bladder neck nicely.  I then proceeded to fragment the dystrophic calcifications that had accumulated along the patient's right lateral lobe.  I did unroofed lots of radiation seeds that I evacuated through the scope.  I then proceeded to ablate additional tissue along the bladder neck both the posterior and anterior aspect as well as on the lateral lobes.  A nice opening was noted at the end of the case.  There is minimal bleeding.  At the end of the case I advanced a 50 [place] two-way Foley catheter to the patient's bladder and irrigated it several times to ensure that there is no additional bleeding or residual tissue.  The patient was subsequently extubated and returned the PACU in stable condition.  Disposition: The patient is being discharged home with a Foley catheter and close follow-up for a voiding trial.  Ardis Hughs, M.D.

## 2020-06-07 NOTE — Discharge Instructions (Signed)

## 2020-06-07 NOTE — Interval H&P Note (Signed)
History and Physical Interval Note:  06/07/2020 10:41 AM  Glen Henry  has presented today for surgery, with the diagnosis of URINARY RETENTION.  The various methods of treatment have been discussed with the patient and family. After consideration of risks, benefits and other options for treatment, the patient has consented to  Procedure(s): LASER TRANSURETHRAL RESECTION OF THE PROSTATE (TURP) HOLMIUM (N/A) as a surgical intervention.  [name] history has been reviewed, patient examined, no change in status, stable for surgery.  I have reviewed the patient's chart and labs.  Questions were answered to the patient's satisfaction.     Ardis Hughs

## 2020-06-07 NOTE — H&P (Signed)
86 year old man with a long urologic history. He was treated for prostate cancer in 2009 with a radiation seed implant. Following this surgery or procedure he has had significant lower urinary tract symptoms. He was treated with a number of different agents and including Toviaz and VESIcare. He also was treated with tamsulosin. His PSAs have all been excellent with no evidence of recurrence of his prostate cancer.   About a month ago the patient was seen in our office for worsening frequency and urgency. He was given Myrbetriq. One week later the patient developed urinary retention. Several of attempts were made for catheter placement our office unsuccessfully. it was at this point that I became involved in attempted to pass a catheter over a wire placed cystoscopically. However, the patient had profound dystrophic calcifications within his prostate causing obstruction and I was unable to do anything in the office. We subsequently went to the operating room where a catheter was placed as I was able to more easily navigate the dystrophic calcifications.   The patient had a Foley catheter placed and subsequently removed. He states for the next 2 days after the catheter was removed he was doing quite well, but has subsequently developed weak stream and urinary frequency again. He denies any hematuria or dysuria.     ALLERGIES: Codeine Derivatives Flomax CP24    MEDICATIONS: Lisinopril  Myrbetriq 25 mg tablet, extended release 24 hr 1 tablet PO Daily  Aspirin 325 MG Oral Tablet Oral  Calcium + D3  Clonazepam  Fish Oil CAPS Oral  Pantoprazole Sodium  Triamcinolone Acetonide  Vitamin C     GU PSH: TRANSPERI NEEDLE PLACE, PROS - 2014 Vasectomy - 2010       [place] Notes: Cataract Surgery, Surgery Prostate Transperineal Placement Of Needles, Laparoscopy Repair Of Initial Inguinal Hernia, Surgery Of Male Genitalia Vasectomy, Skin Tag Removal   NON-GU PSH: Inguinal hernia repair (laparoscopic) -  2012 Removal of Skin Tags - 2009     GU PMH: Bladder Stone - 05/15/2020, - 05/01/2020, Bladder calculus, - 2014 History of prostate cancer - 05/15/2020, - 05/01/2020, - 05/01/2020, - 03/31/2020 (Stable), His prostate was again noted to be smooth and benign with a PSA that remains exceedingly low at 0.01., - 2019 Incomplete bladder emptying - 05/15/2020, - 05/01/2020 Urinary Retention - 05/15/2020, - 05/08/2020 ED due to arterial insufficiency - 03/31/2020, Erectile dysfunction due to arterial insufficiency, - 2014 Urinary Urgency - 03/31/2020 Spermatocele of epididymis, Unspec, Spermatocele - 2014      PMH Notes: Adenocarcinoma of the prostate: He had a long history of a mildly elevated PSA however in 6/09 it was found to have risen to 7.8 with a free to total ratio of 18.3%. At that time his DRE revealed a subtle apical nodule on the left-hand side. Transrectal ultrasound and biopsy was performed on 01/26/08. That revealed an 83 cc gland and the pathology revealed adenocarcinoma Gleason 3+4=7 in 7/12 cores ranging from 10-60% involvement of each of the cores.  Treatment: On 06/15/08 he underwent I-125 seed implantation.   LUTS: Even before he had his radioactive seeds implanted he did have urgency with no urge incontinence. I tried him on anticholinergic therapy (Enablex 15 mg, Toviaz 4 mg and VESIcare 5 mg) and then an alpha-blocker. He said the Flomax resulted in significant increase in urinary frequency and he stopped that and has also tried Rapaflo.  Urodynamics 1/13; 158 cc functional capacity, marked detrusor instability with contraction pressure to 124 cc. Minimal PVR.  Treatment: Subjective  resolution of symptoms without therapy.    He does have a history of erectile dysfunction. I have initiated treatment with Cialis.     NON-GU PMH: Tinea cruris, He appears to have kidney occurs. I am going to send in a prescription for some clotrimazole. - 2019 Encounter for general adult medical  examination without abnormal findings, Encounter for preventive health examination - 2016 Anxiety, Anxiety - 2014    FAMILY HISTORY: Family Health Status Number - Runs In Family Heart Disease - Father, Brother Stroke Syndrome - Mother   SOCIAL HISTORY: No Social History     Notes: Former smoker, Occupation:, Alcohol Use, Previous History Of Smoking, Marital History - Currently Married, Caffeine Use   REVIEW OF SYSTEMS:    GU Review Male:   Patient reports frequent urination, burning/ pain with urination, and get up at night to urinate. Patient denies hard to postpone urination, leakage of urine, stream starts and stops, trouble starting your stream, have to strain to urinate , erection problems, and penile pain.  Gastrointestinal (Upper):   Patient denies nausea, vomiting, and indigestion/ heartburn.  Gastrointestinal (Lower):   Patient denies diarrhea and constipation.  Constitutional:   Patient denies fever, night sweats, weight loss, and fatigue.  Skin:   Patient denies skin rash/ lesion and itching.  Eyes:   Patient denies blurred vision and double vision.  Ears/ Nose/ Throat:   Patient denies sore throat and sinus problems.  Hematologic/Lymphatic:   Patient denies swollen glands and easy bruising.  Cardiovascular:   Patient denies leg swelling and chest pains.  Respiratory:   Patient denies cough and shortness of breath.  Endocrine:   Patient denies excessive thirst.  Musculoskeletal:   Patient denies back pain and joint pain.  Neurological:   Patient denies headaches and dizziness.  Psychologic:   Patient denies depression and anxiety.   Notes: Nocturia x2, burning with urination with first void of the day Split urine stream    VITAL SIGNS:      05/18/2020 03:49 PM  Weight 158 lb / 71.67 kg  Height 69 in / 175.26 cm  BP 124/67 mmHg  Pulse 61 /min  Temperature 97.7 F / 36.5 C  BMI 23.3 kg/m   Complexity of Data:  Source Of History:  Patient  Lab Test Review:   PSA   Records Review:   Pathology Reports, Previous Doctor Records, Previous Patient Records  Urine Test Review:   Urinalysis   03/31/20 11/08/16 09/07/14 08/30/13 08/25/12 02/12/12 04/27/11 10/25/10  PSA  Total PSA <0.015 ng/mL 0.01 ng/dl 0.01  0.02  0.02  0.02  0.03  0.01     PROCEDURES: None   ASSESSMENT:      ICD-10 Details  1 GU:   Bladder Stone - N21.0   2   History of prostate cancer - Z85.46   3   Incomplete bladder emptying - R39.14      PLAN:           Document Letter(s):  Created for Patient: Clinical Summary         Notes:   The patient I discussed the episode of urinary retention in the findings during my cystoscopy. he was significantly better for 2 days, but now was has a splayed stream and worsening frequency. I encouraged him to strongly consider removal of the calcifications within the prostatic urethra. I proposed a laser trans urethral resection of his prostate to fragment the stones and remove any other tissue if necessary. I went through the  procedure with him in detail. I suspect we could do this as an outpatient. He will need medical clearance given his age. Will try to get this scheduled quickly for the patient so he does not develop urinary retention again well waiting for the procedure.

## 2020-06-07 NOTE — Anesthesia Procedure Notes (Addendum)
Procedure Name: LMA Insertion Date/Time: 06/07/2020 10:55 AM Performed by: West Pugh, CRNA Pre-anesthesia Checklist: Patient identified, Emergency Drugs available, Suction available, Patient being monitored and Timeout performed Patient Re-evaluated:Patient Re-evaluated prior to induction Oxygen Delivery Method: Circle system utilized Preoxygenation: Pre-oxygenation with 100% oxygen Induction Type: IV induction LMA: LMA with gastric port inserted LMA Size: 4.0 Number of attempts: 1 Placement Confirmation: positive ETCO2 and breath sounds checked- equal and bilateral Tube secured with: Tape Dental Injury: Teeth and Oropharynx as per pre-operative assessment

## 2020-06-08 ENCOUNTER — Encounter (HOSPITAL_COMMUNITY): Payer: Self-pay | Admitting: Urology

## 2020-06-19 DIAGNOSIS — R8279 Other abnormal findings on microbiological examination of urine: Secondary | ICD-10-CM | POA: Diagnosis not present

## 2020-06-19 DIAGNOSIS — N210 Calculus in bladder: Secondary | ICD-10-CM | POA: Diagnosis not present

## 2020-06-19 DIAGNOSIS — R300 Dysuria: Secondary | ICD-10-CM | POA: Diagnosis not present

## 2020-06-19 DIAGNOSIS — R338 Other retention of urine: Secondary | ICD-10-CM | POA: Diagnosis not present

## 2020-07-11 DIAGNOSIS — H6120 Impacted cerumen, unspecified ear: Secondary | ICD-10-CM | POA: Diagnosis not present

## 2020-08-10 DIAGNOSIS — S0500XA Injury of conjunctiva and corneal abrasion without foreign body, unspecified eye, initial encounter: Secondary | ICD-10-CM | POA: Diagnosis not present

## 2020-08-18 DIAGNOSIS — S0501XA Injury of conjunctiva and corneal abrasion without foreign body, right eye, initial encounter: Secondary | ICD-10-CM | POA: Diagnosis not present

## 2020-08-21 DIAGNOSIS — Z961 Presence of intraocular lens: Secondary | ICD-10-CM | POA: Diagnosis not present

## 2020-09-12 DIAGNOSIS — L309 Dermatitis, unspecified: Secondary | ICD-10-CM | POA: Diagnosis not present

## 2020-09-12 DIAGNOSIS — D692 Other nonthrombocytopenic purpura: Secondary | ICD-10-CM | POA: Diagnosis not present

## 2020-09-12 DIAGNOSIS — E782 Mixed hyperlipidemia: Secondary | ICD-10-CM | POA: Diagnosis not present

## 2020-09-12 DIAGNOSIS — I10 Essential (primary) hypertension: Secondary | ICD-10-CM | POA: Diagnosis not present

## 2020-11-24 DIAGNOSIS — H6120 Impacted cerumen, unspecified ear: Secondary | ICD-10-CM | POA: Diagnosis not present

## 2020-11-24 DIAGNOSIS — H6990 Unspecified Eustachian tube disorder, unspecified ear: Secondary | ICD-10-CM | POA: Diagnosis not present

## 2021-01-02 DIAGNOSIS — R109 Unspecified abdominal pain: Secondary | ICD-10-CM | POA: Diagnosis not present

## 2021-01-02 DIAGNOSIS — L739 Follicular disorder, unspecified: Secondary | ICD-10-CM | POA: Diagnosis not present

## 2021-01-02 DIAGNOSIS — D485 Neoplasm of uncertain behavior of skin: Secondary | ICD-10-CM | POA: Diagnosis not present

## 2021-01-18 ENCOUNTER — Other Ambulatory Visit: Payer: Self-pay

## 2021-01-18 ENCOUNTER — Inpatient Hospital Stay (HOSPITAL_COMMUNITY)
Admission: EM | Admit: 2021-01-18 | Discharge: 2021-01-20 | DRG: 683 | Disposition: A | Discharge status: Home / Self Care | Payer: PPO | Attending: Family Medicine | Admitting: Family Medicine

## 2021-01-18 ENCOUNTER — Encounter (HOSPITAL_COMMUNITY): Payer: Self-pay

## 2021-01-18 ENCOUNTER — Emergency Department (HOSPITAL_COMMUNITY): Payer: PPO

## 2021-01-18 DIAGNOSIS — Z8616 Personal history of COVID-19: Secondary | ICD-10-CM | POA: Diagnosis not present

## 2021-01-18 DIAGNOSIS — Z8546 Personal history of malignant neoplasm of prostate: Secondary | ICD-10-CM | POA: Diagnosis not present

## 2021-01-18 DIAGNOSIS — Z91030 Bee allergy status: Secondary | ICD-10-CM | POA: Diagnosis not present

## 2021-01-18 DIAGNOSIS — N401 Enlarged prostate with lower urinary tract symptoms: Secondary | ICD-10-CM | POA: Diagnosis not present

## 2021-01-18 DIAGNOSIS — Z882 Allergy status to sulfonamides status: Secondary | ICD-10-CM

## 2021-01-18 DIAGNOSIS — N3000 Acute cystitis without hematuria: Secondary | ICD-10-CM | POA: Diagnosis present

## 2021-01-18 DIAGNOSIS — Z9079 Acquired absence of other genital organ(s): Secondary | ICD-10-CM | POA: Diagnosis not present

## 2021-01-18 DIAGNOSIS — I10 Essential (primary) hypertension: Secondary | ICD-10-CM | POA: Diagnosis not present

## 2021-01-18 DIAGNOSIS — E869 Volume depletion, unspecified: Secondary | ICD-10-CM | POA: Diagnosis present

## 2021-01-18 DIAGNOSIS — R339 Retention of urine, unspecified: Secondary | ICD-10-CM | POA: Diagnosis not present

## 2021-01-18 DIAGNOSIS — K219 Gastro-esophageal reflux disease without esophagitis: Secondary | ICD-10-CM | POA: Diagnosis not present

## 2021-01-18 DIAGNOSIS — M1990 Unspecified osteoarthritis, unspecified site: Secondary | ICD-10-CM | POA: Diagnosis not present

## 2021-01-18 DIAGNOSIS — Z66 Do not resuscitate: Secondary | ICD-10-CM | POA: Diagnosis not present

## 2021-01-18 DIAGNOSIS — K449 Diaphragmatic hernia without obstruction or gangrene: Secondary | ICD-10-CM | POA: Diagnosis not present

## 2021-01-18 DIAGNOSIS — Z923 Personal history of irradiation: Secondary | ICD-10-CM

## 2021-01-18 DIAGNOSIS — K5730 Diverticulosis of large intestine without perforation or abscess without bleeding: Secondary | ICD-10-CM | POA: Diagnosis not present

## 2021-01-18 DIAGNOSIS — N136 Pyonephrosis: Secondary | ICD-10-CM | POA: Diagnosis present

## 2021-01-18 DIAGNOSIS — E872 Acidosis, unspecified: Secondary | ICD-10-CM | POA: Diagnosis present

## 2021-01-18 DIAGNOSIS — N179 Acute kidney failure, unspecified: Secondary | ICD-10-CM | POA: Diagnosis not present

## 2021-01-18 DIAGNOSIS — Z888 Allergy status to other drugs, medicaments and biological substances status: Secondary | ICD-10-CM

## 2021-01-18 DIAGNOSIS — Z20822 Contact with and (suspected) exposure to covid-19: Secondary | ICD-10-CM | POA: Diagnosis not present

## 2021-01-18 DIAGNOSIS — Z79899 Other long term (current) drug therapy: Secondary | ICD-10-CM | POA: Diagnosis not present

## 2021-01-18 DIAGNOSIS — Z7982 Long term (current) use of aspirin: Secondary | ICD-10-CM | POA: Diagnosis not present

## 2021-01-18 DIAGNOSIS — N1330 Unspecified hydronephrosis: Secondary | ICD-10-CM | POA: Diagnosis not present

## 2021-01-18 DIAGNOSIS — E876 Hypokalemia: Secondary | ICD-10-CM | POA: Diagnosis not present

## 2021-01-18 DIAGNOSIS — Z85820 Personal history of malignant melanoma of skin: Secondary | ICD-10-CM | POA: Diagnosis not present

## 2021-01-18 DIAGNOSIS — R1030 Lower abdominal pain, unspecified: Secondary | ICD-10-CM | POA: Diagnosis present

## 2021-01-18 DIAGNOSIS — F419 Anxiety disorder, unspecified: Secondary | ICD-10-CM | POA: Diagnosis present

## 2021-01-18 DIAGNOSIS — Z87891 Personal history of nicotine dependence: Secondary | ICD-10-CM

## 2021-01-18 DIAGNOSIS — D72829 Elevated white blood cell count, unspecified: Secondary | ICD-10-CM | POA: Diagnosis not present

## 2021-01-18 DIAGNOSIS — I700 Atherosclerosis of aorta: Secondary | ICD-10-CM | POA: Diagnosis not present

## 2021-01-18 DIAGNOSIS — R338 Other retention of urine: Secondary | ICD-10-CM | POA: Diagnosis not present

## 2021-01-18 DIAGNOSIS — E871 Hypo-osmolality and hyponatremia: Secondary | ICD-10-CM | POA: Diagnosis not present

## 2021-01-18 DIAGNOSIS — N390 Urinary tract infection, site not specified: Secondary | ICD-10-CM | POA: Diagnosis present

## 2021-01-18 MED ORDER — OXYCODONE-ACETAMINOPHEN 5-325 MG PO TABS: 1.0000 | ORAL_TABLET | Freq: Once | ORAL | Status: DC

## 2021-01-18 MED ORDER — ONDANSETRON 8 MG PO TBDP
8.0000 mg | ORAL_TABLET | Freq: Once | ORAL | Status: AC
  Administered 2021-01-18: 8 mg via ORAL
  Filled 2021-01-18: qty 1

## 2021-01-18 MED ORDER — MORPHINE SULFATE (PF) 4 MG/ML IV SOLN
4.0000 mg | Freq: Once | INTRAVENOUS | Status: DC
  Filled 2021-01-18: qty 1

## 2021-01-18 MED ORDER — ONDANSETRON HCL 4 MG/2ML IJ SOLN
4.0000 mg | Freq: Once | INTRAMUSCULAR | Status: AC
  Administered 2021-01-19: 4 mg via INTRAVENOUS

## 2021-01-18 MED ORDER — MORPHINE SULFATE (PF) 4 MG/ML IV SOLN
4.0000 mg | Freq: Once | INTRAVENOUS | Status: AC
  Administered 2021-01-18: 4 mg via INTRAMUSCULAR

## 2021-01-18 MED ORDER — ONDANSETRON HCL 4 MG/2ML IJ SOLN: 4.0000 mg | Freq: Once | INTRAMUSCULAR | Status: DC

## 2021-01-18 MED ORDER — ONDANSETRON HCL 4 MG/2ML IJ SOLN
4.0000 mg | Freq: Once | INTRAMUSCULAR | Status: DC
  Filled 2021-01-18: qty 2

## 2021-01-18 NOTE — ED Notes (Signed)
Patient keeps taking BP cuff and pulse ox

## 2021-01-18 NOTE — ED Provider Notes (Signed)
[facility] DEPT Provider Note   CSN: [id number] Arrival date & time: 01/18/21  1701     History Chief Complaint  Patient presents with   Urinary Retention    Glen Henry is a 87 y.o. male.  HPI Patient is an 87 year old male with a history of BPH, prostate cancer, hypertension, who presents to the emergency department due to urinary retention.  Patient states that he has a history of difficulty urinating and has required a Foley catheter in the past.  Also has a prior history of TURP procedure.  Patient states that he last voided normally around 11 AM this morning.  States that around 2 PM he was able to produce a very small amount of urine but nothing significant.  He has had worsening suprapubic pain and now states that he has no ability to produce urine.  States that he was diagnosed with a UTI recently and has completed 9 out of 10 days of ciprofloxacin twice daily.  Denies any other regions of pain, fevers, chills, nausea, or vomiting.    Past Medical History:  Diagnosis Date   Anxiety    Arthritis    Benign prostate hyperplasia    Cancer ([place]) 2019   history of prostate cancer   Esophageal reflux    H/O melanoma excision    Excised in 1988   H/O prostate cancer    Seed implantation   Hypertension     Patient Active Problem List   Diagnosis Date Noted   Pulmonary nodules 10/23/2011   C. difficile colitis 10/10/2011   Hyponatremia 10/08/2011   Abnormal CT scan, chest 10/08/2011   Diarrhea 10/08/2011   Leukocytosis 10/06/2011   Community acquired pneumonia 10/06/2011   H/O prostate cancer 10/06/2011   H/O melanoma excision 10/06/2011   Encephalopathy 10/06/2011   METHICILLIN RESISTANT STAPHYLOCOCCUS AUREUS INFECTION 03/24/2008    Past Surgical History:  Procedure Laterality Date   CYSTOSCOPY N/A 05/01/2020   Procedure: CYSTOSCOPY catheter placement;  Surgeon: Ardis Hughs, MD;  Location: [facility];  Service: Urology;   Laterality: N/A;   INGUINAL HERNIA REPAIR     Bilaterally   PROSTATE SURGERY  2009   removal of melanoma   1988   removed from back   [place] N/A 06/07/2020   Procedure: LASER TRANSURETHRAL RESECTION OF THE PROSTATE (TURP) HOLMIUM;  Surgeon: Ardis Hughs, MD;  Location: [facility];  Service: Urology;  Laterality: N/A;       Family History  Problem Relation Age of Onset   Coronary artery disease Other    Stroke Other     Social History   Tobacco Use   Smoking status: Former    Types: Cigarettes    Quit date: 10/22/1971    Years since quitting: 49.2   Smokeless tobacco: Never  Vaping Use   Vaping Use: Never used  Substance Use Topics   Alcohol use: No   Drug use: No    Home Medications Prior to Admission medications   Medication Sig Start Date End Date Taking? Authorizing Provider  Ascorbic Acid (VITAMIN C) 500 MG CAPS Take 1,000 mg by mouth daily.    Yes [provider]  aspirin 325 MG tablet Take 325 mg by mouth daily.   Yes [provider]  calcium-vitamin D (OSCAL WITH D) 500-200 MG-UNIT per tablet Take 2 tablets by mouth daily with breakfast.    Yes [provider]  ciprofloxacin (CIPRO) 500 MG tablet Take 500 mg by  mouth 2 (two) times daily. 01/09/21 01/09/21  Yes [provider]  clonazePAM (KLONOPIN) 0.5 MG tablet Take 0.5 mg by mouth at bedtime.    Yes [provider]  fish oil-omega-3 fatty acids 1000 MG capsule Take 2,000 mg by mouth 2 (two) times daily.   Yes [provider]  lisinopril (ZESTRIL) 20 MG tablet Take 20 mg by mouth daily. 09/12/20  Yes [provider]  pantoprazole (PROTONIX) 40 MG tablet Take 40 mg by mouth daily.   Yes [provider]  traMADol (ULTRAM) 50 MG tablet Take 1-2 tablets (50-100 mg total) by mouth every 6 (six) hours as needed for moderate pain. 05/01/20  Yes Ardis Hughs, MD    Allergies    Bee venom, Codeine, Elemental sulfur,  Statins, Sulfa antibiotics, and Welchol [colesevelam hcl]  Review of Systems   Review of Systems  All other systems reviewed and are negative. Ten systems reviewed and are negative for acute change, except as noted in the HPI.   Physical Exam Updated Vital Signs BP (!) 167/84   Pulse 97   Temp 97.7 F (36.5 C) (Oral)   Resp 18   SpO2 98%   Physical Exam Vitals and nursing note reviewed.  Constitutional:      General: He is not in acute distress.    Appearance: Normal appearance. He is not ill-appearing, toxic-appearing or diaphoretic.  HENT:     Head: Normocephalic and atraumatic.     Right Ear: External ear normal.     Left Ear: External ear normal.     Nose: Nose normal.     Mouth/Throat:     Mouth: Mucous membranes are moist.     Pharynx: Oropharynx is clear. No oropharyngeal exudate or posterior oropharyngeal erythema.  Eyes:     Extraocular Movements: Extraocular movements intact.  Cardiovascular:     Rate and Rhythm: Normal rate and regular rhythm.     Pulses: Normal pulses.     Heart sounds: Normal heart sounds. No murmur heard.   No friction rub. No gallop.  Pulmonary:     Effort: Pulmonary effort is normal. No respiratory distress.     Breath sounds: Normal breath sounds. No stridor. No wheezing, rhonchi or rales.  Abdominal:     General: Abdomen is flat.     Palpations: Abdomen is soft.     Tenderness: There is abdominal tenderness.     Comments: Abdomen is flat and soft.  Moderate suprapubic tenderness noted.  Musculoskeletal:        General: Normal range of motion.     Cervical back: Normal range of motion and neck supple. No tenderness.  Skin:    General: Skin is warm and dry.  Neurological:     General: No focal deficit present.     Mental Status: He is alert and oriented to person, place, and time.  Psychiatric:        Mood and Affect: Mood normal.        Behavior: Behavior normal.   ED Results / Procedures / Treatments   Labs (all labs ordered  are listed, but only abnormal results are displayed) Labs Reviewed  URINE CULTURE  URINALYSIS, ROUTINE W REFLEX MICROSCOPIC  COMPREHENSIVE METABOLIC PANEL  CBC WITH DIFFERENTIAL/PLATELET   EKG None  Radiology CT ABDOMEN PELVIS WO CONTRAST  Result Date: 01/18/2021 CLINICAL DATA:  87 year old male with acute abdominal pain. EXAM: CT ABDOMEN AND PELVIS WITHOUT CONTRAST TECHNIQUE: Multidetector CT imaging of the abdomen and pelvis was  performed following the standard protocol without IV contrast. COMPARISON:  Chest radiograph dated 04/10/2016. FINDINGS: Evaluation of this exam is limited in the absence of intravenous contrast. Lower chest: There are bibasilar linear atelectasis/scarring. The visualized lung bases are otherwise clear. No intra-abdominal free air or free fluid. Hepatobiliary: No focal liver abnormality is seen. No gallstones, gallbladder wall thickening, or biliary dilatation. Pancreas: Unremarkable. No pancreatic ductal dilatation or surrounding inflammatory changes. Spleen: Normal in size without focal abnormality. Adrenals/Urinary Tract: The adrenal glands are unremarkable. There is a 3 mm high attenuating focus along the left posterior bladder wall adjacent to the left UVJ which may be artifactual or represent a recently passed stone or left UVJ calculus. There is mild left hydronephrosis. Mild left perinephric stranding. Correlation with urinalysis recommended to exclude superimposed UTI. The right kidney, and the right ureter and urinary bladder appear unremarkable. Stomach/Bowel: There is sigmoid diverticulosis without active inflammatory changes. There is moderate stool throughout the colon. There is a small hiatal hernia. There is no bowel obstruction or active inflammation. The appendix is normal. Vascular/Lymphatic: Advanced aortoiliac atherosclerotic disease. The IVC is unremarkable. No portal venous gas. There is no adenopathy. Reproductive: Prostate brachytherapy seeds. Other:  None Musculoskeletal: Degenerative changes of the spine. No acute osseous pathology. IMPRESSION: 1. A 3 mm recently passed stone or left UVJ calculus with mild left hydronephrosis. Correlation with urinalysis recommended to exclude superimposed UTI. 2. Sigmoid diverticulosis. No bowel obstruction. Normal appendix. 3. Aortic Atherosclerosis (ICD10-I70.0). Electronically Signed   By: Anner Crete M.D.   On: 01/18/2021 23:55    Procedures Procedures   Medications Ordered in ED Medications  ondansetron (ZOFRAN) injection 4 mg (has no administration in time range)  lidocaine (XYLOCAINE) 2 % jelly 1 application (has no administration in time range)  lidocaine (XYLOCAINE) 2 % jelly 1 application (has no administration in time range)  morphine 4 MG/ML injection 4 mg (4 mg Intramuscular Given 01/18/21 2237)  ondansetron (ZOFRAN-ODT) disintegrating tablet 8 mg (8 mg Oral Given 01/18/21 2242)   ED Course  I have reviewed the triage vital signs and the nursing notes.  Pertinent labs & imaging results that were available during my care of the patient were reviewed by me and considered in my medical decision making (see chart for details).  Clinical Course as of 01/19/21 0007  Thu Jan 18, 2021  1958 Bladder scan obtained showing 268 cc of urine. [LJ]  2009 Nursing staff notified me that Foley catheter was placed but this produced no urine.  I reviewed patient's records and it appears that previously urology had difficulty placing a catheter as well.  He ultimately had to place a catheter in the operating room.  We will obtain basic labs as well as a CT scan of the abdomen.  Will likely consult urology. [LJ]  Fri Jan 19, 2021  0000 I spoke to urology.  They requested that we get the cystoscopy cart from the OR as well as urethral lidocaine jelly.  Reconsult them when this is prepared. [LJ]    Clinical Course User Index [LJ] Rayna Sexton, PA-C   MDM Rules/Calculators/A&P                           Pt is a 87 y.o. male who presents to the emergency department due to urinary retention.  CT scan of the abdomen and pelvis without contrast shows a 3 mm recently passed stone or left UVJ calculus with mild left hydronephrosis.  Sigmoid diverticulosis.  No bowel obstruction.  Normal appendix.  Patient has a history of urinary retention secondary to BPH/prostate cancer.  In December of last year patient needed to be taken to the OR to have a catheter placed.  Nursing staff unable to place a Foley catheter and myself as well as my attending physician were unable to place a coud tip catheter.  Patient discussed with on-call urology.  They requested a cystoscopy cart as well as urethral lidocaine jelly at bedside and they will evaluate the patient in the ED.  It is in my shift and patient care is being transferred to [employer].  Patient pending urology evaluation.  Note: Portions of this report may have been transcribed using voice recognition software. Every effort was made to ensure accuracy; however, inadvertent computerized transcription errors may be present.   Final Clinical Impression(s) / ED Diagnoses Final diagnoses:  Urinary retention   Rx / DC Orders ED Discharge Orders     None        Rayna Sexton, PA-C 01/19/21 0009    Arnaldo Natal, MD 01/19/21 [phone]

## 2021-01-18 NOTE — ED Triage Notes (Signed)
Pt states he last urinated at approx 1430 and it was very little. Pt c/o discomfort and needing to urinate and not being able to. Pt states last year he had difficulty urinating and the urologist had to place a catheter. Pt has hx of prostate cancer.

## 2021-01-18 NOTE — ED Notes (Signed)
Foley catheter inserted with no urine return. PA notified of patient's status and continued PA. We will repeat bladder scan now

## 2021-01-19 ENCOUNTER — Encounter (HOSPITAL_COMMUNITY): Payer: Self-pay | Admitting: Internal Medicine

## 2021-01-19 DIAGNOSIS — F419 Anxiety disorder, unspecified: Secondary | ICD-10-CM | POA: Diagnosis present

## 2021-01-19 DIAGNOSIS — R339 Retention of urine, unspecified: Secondary | ICD-10-CM | POA: Diagnosis not present

## 2021-01-19 DIAGNOSIS — N179 Acute kidney failure, unspecified: Secondary | ICD-10-CM | POA: Diagnosis not present

## 2021-01-19 DIAGNOSIS — N390 Urinary tract infection, site not specified: Secondary | ICD-10-CM

## 2021-01-19 DIAGNOSIS — K219 Gastro-esophageal reflux disease without esophagitis: Secondary | ICD-10-CM | POA: Diagnosis not present

## 2021-01-19 DIAGNOSIS — E872 Acidosis, unspecified: Secondary | ICD-10-CM | POA: Diagnosis present

## 2021-01-19 LAB — URINALYSIS, ROUTINE W REFLEX MICROSCOPIC
Bilirubin Urine: NEGATIVE
Glucose, UA: NEGATIVE mg/dL
Ketones, ur: NEGATIVE mg/dL
Nitrite: NEGATIVE
Protein, ur: 100 mg/dL — AB
Specific Gravity, Urine: 1.011 (ref 1.005–1.030)
pH: 7 (ref 5.0–8.0)

## 2021-01-19 LAB — MAGNESIUM: Magnesium: 1.6 mg/dL — ABNORMAL LOW (ref 1.7–2.4)

## 2021-01-19 LAB — COMPREHENSIVE METABOLIC PANEL
ALT: 21 U/L (ref 0–44)
ALT: 14 U/L (ref 0–44)
AST: 29 U/L (ref 15–41)
AST: 21 U/L (ref 15–41)
Albumin: 4.3 g/dL (ref 3.5–5.0)
Albumin: 2.8 g/dL — ABNORMAL LOW (ref 3.5–5.0)
Alkaline Phosphatase: 62 U/L (ref 38–126)
Alkaline Phosphatase: 40 U/L (ref 38–126)
Anion gap: 13 (ref 5–15)
Anion gap: 5 (ref 5–15)
BUN: 24 mg/dL — ABNORMAL HIGH (ref 8–23)
BUN: 17 mg/dL (ref 8–23)
CO2: 21 mmol/L — ABNORMAL LOW (ref 22–32)
CO2: 22 mmol/L (ref 22–32)
Calcium: 10.2 mg/dL (ref 8.9–10.3)
Calcium: 7.1 mg/dL — ABNORMAL LOW (ref 8.9–10.3)
Chloride: 101 mmol/L (ref 98–111)
Chloride: 113 mmol/L — ABNORMAL HIGH (ref 98–111)
Creatinine, Ser: 1.79 mg/dL — ABNORMAL HIGH (ref 0.61–1.24)
Creatinine, Ser: 0.91 mg/dL (ref 0.61–1.24)
GFR, Estimated: 36 mL/min — ABNORMAL LOW (ref >60)
GFR, Estimated: >60 mL/min (ref >60)
Glucose, Bld: 168 mg/dL — ABNORMAL HIGH (ref 70–99)
Glucose, Bld: 100 mg/dL — ABNORMAL HIGH (ref 70–99)
Potassium: 3.6 mmol/L (ref 3.5–5.1)
Potassium: 3 mmol/L — ABNORMAL LOW (ref 3.5–5.1)
Sodium: 135 mmol/L (ref 135–145)
Sodium: 140 mmol/L (ref 135–145)
Total Bilirubin: 1 mg/dL (ref 0.3–1.2)
Total Bilirubin: 0.7 mg/dL (ref 0.3–1.2)
Total Protein: 7.4 g/dL (ref 6.5–8.1)
Total Protein: 5 g/dL — ABNORMAL LOW (ref 6.5–8.1)

## 2021-01-19 LAB — CBC WITH DIFFERENTIAL/PLATELET
Abs Immature Granulocytes: 0.18 10*3/uL — ABNORMAL HIGH (ref 0.00–0.07)
Abs Immature Granulocytes: 0.06 10*3/uL (ref 0.00–0.07)
Basophils Absolute: 0.1 10*3/uL (ref 0.0–0.1)
Basophils Absolute: 0 10*3/uL (ref 0.0–0.1)
Basophils Relative: 0 %
Basophils Relative: 0 %
Eosinophils Absolute: 0 10*3/uL (ref 0.0–0.5)
Eosinophils Absolute: 0.1 10*3/uL (ref 0.0–0.5)
Eosinophils Relative: 0 %
Eosinophils Relative: 0 %
HCT: 43.7 % (ref 39.0–52.0)
HCT: 34.6 % — ABNORMAL LOW (ref 39.0–52.0)
Hemoglobin: 15.2 g/dL (ref 13.0–17.0)
Hemoglobin: 11.4 g/dL — ABNORMAL LOW (ref 13.0–17.0)
Immature Granulocytes: 1 %
Immature Granulocytes: 0 %
Lymphocytes Relative: 4 %
Lymphocytes Relative: 15 %
Lymphs Abs: 1.2 10*3/uL (ref 0.7–4.0)
Lymphs Abs: 2.4 10*3/uL (ref 0.7–4.0)
MCH: 31 pg (ref 26.0–34.0)
MCH: 30.6 pg (ref 26.0–34.0)
MCHC: 34.8 g/dL (ref 30.0–36.0)
MCHC: 32.9 g/dL (ref 30.0–36.0)
MCV: 89.2 fL (ref 80.0–100.0)
MCV: 92.8 fL (ref 80.0–100.0)
Monocytes Absolute: 1.8 10*3/uL — ABNORMAL HIGH (ref 0.1–1.0)
Monocytes Absolute: 1 10*3/uL (ref 0.1–1.0)
Monocytes Relative: 7 %
Monocytes Relative: 6 %
Neutro Abs: 24.4 10*3/uL — ABNORMAL HIGH (ref 1.7–7.7)
Neutro Abs: 12.3 10*3/uL — ABNORMAL HIGH (ref 1.7–7.7)
Neutrophils Relative %: 88 %
Neutrophils Relative %: 79 %
Platelets: 373 10*3/uL (ref 150–400)
Platelets: 247 10*3/uL (ref 150–400)
RBC: 4.9 MIL/uL (ref 4.22–5.81)
RBC: 3.73 MIL/uL — ABNORMAL LOW (ref 4.22–5.81)
RDW: 13.4 % (ref 11.5–15.5)
RDW: 13.9 % (ref 11.5–15.5)
WBC: 27.6 10*3/uL — ABNORMAL HIGH (ref 4.0–10.5)
WBC: 15.8 10*3/uL — ABNORMAL HIGH (ref 4.0–10.5)
nRBC: 0 % (ref 0.0–0.2)
nRBC: 0 % (ref 0.0–0.2)

## 2021-01-19 LAB — RESP PANEL BY RT-PCR (FLU A&B, COVID) ARPGX2
Influenza A by PCR: NEGATIVE
Influenza B by PCR: NEGATIVE
SARS Coronavirus 2 by RT PCR: NEGATIVE

## 2021-01-19 LAB — LACTIC ACID, PLASMA
Lactic Acid, Venous: 2.9 mmol/L (ref 0.5–1.9)
Lactic Acid, Venous: 1 mmol/L (ref 0.5–1.9)

## 2021-01-19 LAB — MRSA NEXT GEN BY PCR, NASAL: MRSA by PCR Next Gen: NOT DETECTED

## 2021-01-19 MED ORDER — CALCIUM CARBONATE-VITAMIN D 500-200 MG-UNIT PO TABS
2.0000 | ORAL_TABLET | Freq: Every day | ORAL | Status: DC
  Administered 2021-01-19 – 2021-01-20 (×2): 2 via ORAL
  Filled 2021-01-19 (×2): qty 2

## 2021-01-19 MED ORDER — CHLORHEXIDINE GLUCONATE CLOTH 2 % EX PADS
6.0000 | MEDICATED_PAD | Freq: Every day | CUTANEOUS | Status: DC
  Administered 2021-01-19 – 2021-01-20 (×2): 6 via TOPICAL

## 2021-01-19 MED ORDER — SODIUM CHLORIDE 0.9 % IV BOLUS
1000.0000 mL | Freq: Once | INTRAVENOUS | Status: AC
  Administered 2021-01-19: 1000 mL via INTRAVENOUS

## 2021-01-19 MED ORDER — SODIUM CHLORIDE 0.9 % IV SOLN
2.0000 g | INTRAVENOUS | Status: DC
  Administered 2021-01-20: 2 g via INTRAVENOUS
  Filled 2021-01-19: qty 20
  Filled 2021-01-19: qty 2

## 2021-01-19 MED ORDER — ONDANSETRON HCL 4 MG PO TABS: 4.0000 mg | ORAL_TABLET | Freq: Four times a day (QID) | ORAL | Status: DC | PRN

## 2021-01-19 MED ORDER — CLONAZEPAM 0.5 MG PO TABS
0.5000 mg | ORAL_TABLET | Freq: Every day | ORAL | Status: DC
  Administered 2021-01-19: 0.5 mg via ORAL
  Filled 2021-01-19: qty 1

## 2021-01-19 MED ORDER — ACETAMINOPHEN 325 MG PO TABS
650.0000 mg | ORAL_TABLET | Freq: Four times a day (QID) | ORAL | Status: DC | PRN
  Administered 2021-01-19: 650 mg via ORAL

## 2021-01-19 MED ORDER — MAGNESIUM SULFATE 2 GM/50ML IV SOLN
2.0000 g | Freq: Once | INTRAVENOUS | Status: AC
  Administered 2021-01-19: 2 g via INTRAVENOUS
  Filled 2021-01-19: qty 50

## 2021-01-19 MED ORDER — POLYETHYLENE GLYCOL 3350 17 G PO PACK: 17.0000 g | PACK | Freq: Every day | ORAL | Status: DC | PRN

## 2021-01-19 MED ORDER — SODIUM CHLORIDE 0.9 % IV SOLN: INTRAVENOUS | Status: DC

## 2021-01-19 MED ORDER — POTASSIUM CHLORIDE CRYS ER 10 MEQ PO TBCR
40.0000 meq | EXTENDED_RELEASE_TABLET | Freq: Two times a day (BID) | ORAL | Status: AC
  Administered 2021-01-19 (×2): 40 meq via ORAL
  Filled 2021-01-19: qty 2
  Filled 2021-01-19: qty 4

## 2021-01-19 MED ORDER — SODIUM CHLORIDE 0.9 % IV SOLN
2.0000 g | INTRAVENOUS | Status: DC
  Administered 2021-01-19: 2 g via INTRAVENOUS
  Filled 2021-01-19: qty 20

## 2021-01-19 MED ORDER — OMEGA-3-ACID ETHYL ESTERS 1 G PO CAPS
2000.0000 mg | ORAL_CAPSULE | Freq: Two times a day (BID) | ORAL | Status: DC
  Administered 2021-01-19 – 2021-01-20 (×3): 2000 mg via ORAL
  Filled 2021-01-19 (×3): qty 2

## 2021-01-19 MED ORDER — PANTOPRAZOLE SODIUM 40 MG PO TBEC
40.0000 mg | DELAYED_RELEASE_TABLET | Freq: Every day | ORAL | Status: DC
  Administered 2021-01-19 – 2021-01-20 (×2): 40 mg via ORAL
  Filled 2021-01-19 (×2): qty 1

## 2021-01-19 MED ORDER — ACETAMINOPHEN 650 MG RE SUPP: 650.0000 mg | Freq: Four times a day (QID) | RECTAL | Status: DC | PRN

## 2021-01-19 MED ORDER — ONDANSETRON HCL 4 MG/2ML IJ SOLN: 4.0000 mg | Freq: Four times a day (QID) | INTRAMUSCULAR | Status: DC | PRN

## 2021-01-19 MED ORDER — LIDOCAINE HCL URETHRAL/MUCOSAL 2 % EX GEL
1.0000 "application " | Freq: Once | CUTANEOUS | Status: AC
  Administered 2021-01-19: 1 via URETHRAL
  Filled 2021-01-19: qty 22

## 2021-01-19 MED ORDER — POLYVINYL ALCOHOL 1.4 % OP SOLN
1.0000 [drp] | OPHTHALMIC | Status: DC | PRN
  Administered 2021-01-20: 1 [drp] via OPHTHALMIC
  Filled 2021-01-19: qty 15

## 2021-01-19 MED ORDER — LIDOCAINE HCL URETHRAL/MUCOSAL 2 % EX GEL
1.0000 "application " | Freq: Once | CUTANEOUS | Status: AC
  Administered 2021-01-19: 1 via URETHRAL
  Filled 2021-01-19: qty 11

## 2021-01-19 MED ORDER — MORPHINE SULFATE (PF) 2 MG/ML IV SOLN
2.0000 mg | Freq: Once | INTRAVENOUS | Status: AC
  Administered 2021-01-19: 2 mg via INTRAVENOUS
  Filled 2021-01-19: qty 1

## 2021-01-19 MED ORDER — ASCORBIC ACID 500 MG PO TABS
1000.0000 mg | ORAL_TABLET | Freq: Every day | ORAL | Status: DC
  Administered 2021-01-19 – 2021-01-20 (×2): 1000 mg via ORAL
  Filled 2021-01-19 (×3): qty 2

## 2021-01-19 NOTE — ED Notes (Signed)
Urologist at bedside placing urinary catheter. Urine return. 221ml red/ clear

## 2021-01-19 NOTE — H&P (Signed)
History and Physical    Glen Henry:[id number] DOB: [birth year] DOA: 01/18/2021  PCP: Mayra Neer, MD  Patient coming from: Home   Chief Complaint:  Chief Complaint  Patient presents with   Urinary Retention     HPI:    87 year old male with past medical history of prostate cancer (Dx 2011 S/P radiation therapy), hypertension, gastroesophageal reflux disease and bouts of urinary retention with difficult Foley catheter placement in the past who presented to [facility] emergency department with complaints of lower abdominal pain.  Patient explains that early in the afternoon on 7/14 realized that he could no longer urinate.  From that point onward, the patient began to develop progressively worsening lower abdominal and penile pain.  Patient describes his pain as sharp in quality, 10 out of 10 in intensity, nonradiating, worse with movement or attempts to urinate.  Patient complains of associated nausea and several bouts of nonbilious nonbloody vomiting.  Patient denies any diarrhea or fever.  Patient denies any sick contacts recent travel or confirmed contact with COVID-19 infection.  Patient's abdominal pain and inability urinate continued to persist until the early morning of 7/15 after which the patient presented to [facility] emergency department for evaluation.  Upon evaluation in the emergency department multiple times and Foley catheter placement using coud catheters by the ER staff failed.  Urology was contacted and promptly came to evaluate the patient and performed a cystoscope assisted catheter placement successfully.  Of note, patient was also noted to have a markedly elevated white blood cell count at 27.6 and evidence of mild acute kidney injury and therefore patient was initiated on intravenous fluids and intravenous ceftriaxone as well.  Urology recommended hospitalization under the medicine service for close monitoring of renal function and  treatment of suspected urinary tract infection.   Review of Systems:   Review of Systems  Gastrointestinal:  Positive for abdominal pain and vomiting.  Genitourinary:  Positive for dysuria.  All other systems reviewed and are negative.  Past Medical History:  Diagnosis Date   Anxiety    Arthritis    Benign prostate hyperplasia    Cancer ([place]) 2019   history of prostate cancer   Esophageal reflux    H/O melanoma excision    Excised in 1988   H/O prostate cancer    Seed implantation   Hypertension     Past Surgical History:  Procedure Laterality Date   CYSTOSCOPY N/A 05/01/2020   Procedure: CYSTOSCOPY catheter placement;  Surgeon: Ardis Hughs, MD;  Location: [facility];  Service: Urology;  Laterality: N/A;   INGUINAL HERNIA REPAIR     Bilaterally   PROSTATE SURGERY  2009   removal of melanoma   1988   removed from back   [place] N/A 06/07/2020   Procedure: LASER TRANSURETHRAL RESECTION OF THE PROSTATE (TURP) HOLMIUM;  Surgeon: Ardis Hughs, MD;  Location: [facility];  Service: Urology;  Laterality: N/A;     reports that he quit smoking about 49 years ago. His smoking use included cigarettes. He has never used smokeless tobacco. He reports that he does not drink alcohol and does not use drugs.  Allergies  Allergen Reactions   Bee Venom Other (See Comments)    Swelling at site of sting    Codeine Nausea And Vomiting   Elemental Sulfur Other (See Comments)    unknown   Statins     aches   Sulfa Antibiotics    Welchol Melvyn Neth  Hcl]     aches    Family History  Problem Relation Age of Onset   Coronary artery disease Other    Stroke Other      Prior to Admission medications   Medication Sig Start Date End Date Taking? Authorizing Provider  Ascorbic Acid (VITAMIN C) 500 MG CAPS Take 1,000 mg by mouth daily.    Yes [provider]  aspirin 325 MG tablet Take 325 mg by mouth daily.   Yes [provider]   calcium-vitamin D (OSCAL WITH D) 500-200 MG-UNIT per tablet Take 2 tablets by mouth daily with breakfast.    Yes [provider]  ciprofloxacin (CIPRO) 500 MG tablet Take 500 mg by mouth 2 (two) times daily. 01/09/21 01/09/21  Yes [provider]  clonazePAM (KLONOPIN) 0.5 MG tablet Take 0.5 mg by mouth at bedtime.    Yes [provider]  fish oil-omega-3 fatty acids 1000 MG capsule Take 2,000 mg by mouth 2 (two) times daily.   Yes [provider]  lisinopril (ZESTRIL) 20 MG tablet Take 20 mg by mouth daily. 09/12/20  Yes [provider]  pantoprazole (PROTONIX) 40 MG tablet Take 40 mg by mouth daily.   Yes [provider]  traMADol (ULTRAM) 50 MG tablet Take 1-2 tablets (50-100 mg total) by mouth every 6 (six) hours as needed for moderate pain. 05/01/20  Yes Ardis Hughs, MD    Physical Exam: Vitals:   01/19/21 0430 01/19/21 0456 01/19/21 0500 01/19/21 0600  BP: 125/71 127/61 138/71 129/75  Pulse: 68 74 73 70  Resp:  16 18 18   Temp:      TempSrc:      SpO2: 96% 97% 99% 96%    Constitutional: Lethargic but arousable and oriented x3, no associated distress.   Skin: no rashes, no lesions, good skin turgor noted. Eyes: Pupils are equally reactive to light.  No evidence of scleral icterus or conjunctival pallor.  ENMT: Moist mucous membranes noted.  Posterior pharynx clear of any exudate or lesions.   Neck: normal, supple, no masses, no thyromegaly.  No evidence of jugular venous distension.   Respiratory: clear to auscultation bilaterally, no wheezing, no crackles. Normal respiratory effort. No accessory muscle use.  Cardiovascular: Regular rate and rhythm, no murmurs / rubs / gallops. No extremity edema. 2+ pedal pulses. No carotid bruits.  Chest:   Nontender without crepitus or deformity.   Back:   Nontender without crepitus or deformity. Abdomen: Mild lower abdominal tenderness noted.  Abdomen soft however.  No evidence of  intra-abdominal masses.  Positive bowel sounds noted in all quadrants.   Musculoskeletal: No joint deformity upper and lower extremities. Good ROM, no contractures. Normal muscle tone.  Neurologic: CN 2-12 grossly intact. Sensation intact.  Patient moving all 4 extremities spontaneously.  Patient is following all commands.  Patient is responsive to verbal stimuli.   Psychiatric: Patient exhibits normal mood with appropriate affect.  Patient seems to possess insight as to their current situation.   GU: Foley catheter is in place draining rose-colored urine.   Labs on Admission: I have personally reviewed following labs and imaging studies -   CBC: Recent Labs  Lab 01/19/21 0030  WBC 27.6*  NEUTROABS 24.4*  HGB 15.2  HCT 43.7  MCV 89.2  PLT 952   Basic Metabolic Panel: Recent Labs  Lab 01/19/21 0030  NA 135  K 3.6  CL 101  CO2 21*  GLUCOSE 168*  BUN 24*  CREATININE 1.79*  CALCIUM 10.2   GFR: CrCl cannot be calculated (Unknown ideal weight.). Liver Function Tests: Recent Labs  Lab 01/19/21 0030  AST 29  ALT 21  ALKPHOS 62  BILITOT 1.0  PROT 7.4  ALBUMIN 4.3   No results for input(s): LIPASE, AMYLASE in the last 168 hours. No results for input(s): AMMONIA in the last 168 hours. Coagulation Profile: No results for input(s): INR, PROTIME in the last 168 hours. Cardiac Enzymes: No results for input(s): CKTOTAL, CKMB, CKMBINDEX, TROPONINI in the last 168 hours. BNP (last 3 results) No results for input(s): PROBNP in the last 8760 hours. HbA1C: No results for input(s): HGBA1C in the last 72 hours. CBG: No results for input(s): GLUCAP in the last 168 hours. Lipid Profile: No results for input(s): CHOL, HDL, LDLCALC, TRIG, CHOLHDL, LDLDIRECT in the last 72 hours. Thyroid Function Tests: No results for input(s): TSH, T4TOTAL, FREET4, T3FREE, THYROIDAB in the last 72 hours. Anemia Panel: No results for input(s): VITAMINB12, FOLATE, FERRITIN, TIBC, IRON, RETICCTPCT in  the last 72 hours. Urine analysis:    Component Value Date/Time   COLORURINE YELLOW 01/19/2021 0100   APPEARANCEUR HAZY (A) 01/19/2021 0100   LABSPEC 1.011 01/19/2021 0100   PHURINE 7.0 01/19/2021 0100   GLUCOSEU NEGATIVE 01/19/2021 0100   HGBUR LARGE (A) 01/19/2021 0100   BILIRUBINUR NEGATIVE 01/19/2021 0100   KETONESUR NEGATIVE 01/19/2021 0100   PROTEINUR 100 (A) 01/19/2021 0100   UROBILINOGEN 0.2 10/08/2011 1407   NITRITE NEGATIVE 01/19/2021 0100   LEUKOCYTESUR MODERATE (A) 01/19/2021 0100    Radiological Exams on Admission - Personally Reviewed: CT ABDOMEN PELVIS WO CONTRAST  Result Date: 01/18/2021 CLINICAL DATA:  87 year old male with acute abdominal pain. EXAM: CT ABDOMEN AND PELVIS WITHOUT CONTRAST TECHNIQUE: Multidetector CT imaging of the abdomen and pelvis was performed following the standard protocol without IV contrast. COMPARISON:  Chest radiograph dated 04/10/2016. FINDINGS: Evaluation of this exam is limited in the absence of intravenous contrast. Lower chest: There are bibasilar linear atelectasis/scarring. The visualized lung bases are otherwise clear. No intra-abdominal free air or free fluid. Hepatobiliary: No focal liver abnormality is seen. No gallstones, gallbladder wall thickening, or biliary dilatation. Pancreas: Unremarkable. No pancreatic ductal dilatation or surrounding inflammatory changes. Spleen: Normal in size without focal abnormality. Adrenals/Urinary Tract: The adrenal glands are unremarkable. There is a 3 mm high attenuating focus along the left posterior bladder wall adjacent to the left UVJ which may be artifactual or represent a recently passed stone or left UVJ calculus. There is mild left hydronephrosis. Mild left perinephric stranding. Correlation with urinalysis recommended to exclude superimposed UTI. The right kidney, and the right ureter and urinary bladder appear unremarkable. Stomach/Bowel: There is sigmoid diverticulosis without active  inflammatory changes. There is moderate stool throughout the colon. There is a small hiatal hernia. There is no bowel obstruction or active inflammation. The appendix is normal. Vascular/Lymphatic: Advanced aortoiliac atherosclerotic disease. The IVC is unremarkable. No portal venous gas. There is no adenopathy. Reproductive: Prostate brachytherapy seeds. Other: None Musculoskeletal: Degenerative changes of the spine. No acute osseous pathology. IMPRESSION: 1. A 3 mm recently passed stone or left UVJ calculus with mild left hydronephrosis. Correlation with urinalysis recommended to exclude superimposed UTI. 2. Sigmoid diverticulosis. No bowel obstruction. Normal appendix. 3. Aortic Atherosclerosis (ICD10-I70.0). Electronically Signed   By: Anner Crete M.D.   On: 01/18/2021 23:55    Telemetry: Personally reviewed.  Rhythm is normal sinus rhythm.   Assessment/Plan Principal Problem:   AKI (acute kidney injury) ([place]) secondary to  postobstructive etiology from urinary retention  Patient suffering from acute kidney injury secondary to acute urinary retention CT imaging revealing mild left hydronephrosis Patient has a known history of difficult Foley catheter placement in the past due to a history of prostate cancer status postradiation with dystrophic prostatic calcifications  Patient is status post video-assisted cystoscopy with catheter placement by urology early in the morning 7/15 Hydrating patient gently with intravenous isotonic fluid Strict input and output monitoring Monitoring renal function and electrolytes with serial chemistries Avoiding nephrotoxic agents  Active Problems:   Complicated UTI (urinary tract infection)  Patient exhibiting substantial leukocytosis at 27.6 and a somewhat abnormal urinalysis all suggestive of presumed complicated urinary tract infection  Patient has been initiated on intravenous ceftriaxone by the emergency department staff which will be continued at this  time We will de-escalate antibiotic therapy based on culture results    Lactic acidosis  Lactic acidosis secondary to volume depletion and ongoing infection Hydrating patient with intravenous isotonic fluids and providing patient with intravenous antibiotics Performing serial lactic acid levels to ensure downtrending and resolution    GERD without esophagitis  Continue home regimen of PPI    Anxiety disorder  Continue home regimen of anxiolytics as confirmed via the controlled substance database  Code Status:  DNR Family Communication: deferred   Status is: Observation  The patient remains OBS appropriate and will d/c before 2 midnights.  Dispo: The patient is from: Home              Anticipated d/c is to: Home              Patient currently is not medically stable to d/c.   Difficult to place patient No        Vernelle Emerald MD [facility] Pager [phone]  If 7PM-7AM, please contact night-coverage [email or web address] Use universal [place] password for that web site. If you do not have the password, please call the hospital operator.  01/19/2021, 8:47 AM

## 2021-01-19 NOTE — ED Notes (Signed)
Pt given sandwich 

## 2021-01-19 NOTE — ED Notes (Signed)
Patient denies pain and is resting comfortably.  

## 2021-01-19 NOTE — ED Provider Notes (Addendum)
Assumed care from PA Joldersma at shift change.  See prior note for full H&P.  Briefly, 87 year old male here with urinary retention.  Difficulty placing Foley catheter here.  Urology has been consulted.  1:06 AM Urology has evaluated at bedside-- able to place foley catheter with scope and is freely draining.  WBC count has come back at 27K.  SrCr elevated above baseline but felt to be post-renal related to his retention.  Urology recommends to start IV abx pending urine culture and admit for close monitoring.  Urology team will follow-up in AM to check on progress.    Patient is noted to have low grade fever here at 7F but no tachycardia.  BP stable.  Given his age and high WBC count, will add on lactic acid, blood cultures for complete evaluation.    Thus far foley has drained out approx 500cc bloody urine.  May be some traumatic bleeding for multiple foley attempts.  Will admit for ongoing care.  Discussed with Dr. Cyd Silence-- will admit for ongoing care.  2:29 AM Lactic has come back at 2.9.  will initiate IVFB and plan to repeat lactic afterwards.  Admitting physician has been updated.   Larene Pickett, PA-C 01/19/21 0140    Larene Pickett, PA-C 01/19/21 2023    Ezequiel Essex, MD 01/19/21 [id number]

## 2021-01-19 NOTE — ED Notes (Addendum)
Purple DNR sticker on pt armband.

## 2021-01-19 NOTE — Progress Notes (Signed)
PROGRESS NOTE    Glen Henry  XBL:[id number] DOB: 11-12-1930 DOA: 01/18/2021 PCP: Mayra Neer, MD   Brief Narrative:  87 year old male with past medical history of prostate cancer (Dx 2011 S/P radiation therapy), hypertension, gastroesophageal reflux disease and bouts of urinary retention with difficult Foley catheter placement in the past who presented to [facility] emergency department with complaints of lower abdominal pain.  Upon evaluation in the emergency department multiple times and Foley catheter placement using coud catheters by the ER staff failed.  Urology was contacted and promptly came to evaluate the patient and performed a cystoscope assisted catheter placement successfully, patient was also noted to have a markedly elevated white blood cell count at 27.6 and evidence of mild acute kidney injury and therefore patient was initiated on intravenous fluids and intravenous ceftriaxone.   Assessment & Plan:   AKI secondary to acute urinary retention: (Hx of prostate cancer s/p radiation therapy) -CT abdomen/pelvis shows mild left hydronephrosis. -Baseline creatinine 0.8, 1.7 on admission.  GFR 36. -He has a known history of difficult Foley catheter placement in the past due to prostate cancer s/p radiation therapy with dystrophic prostatic calcifications. -S/p video-assisted cystoscopy with catheter placement by urology -Continue IV fluids -Avoid nephrotoxic medication.  Monitor kidney function closely -Urology recommended to consider renal ultrasound in 24 to 48 hours to ensure improvement of hydronephrosis and continue Foley catheter on discharge and outpatient follow-up-appreciate neurology's recommendations  UTI: -UA positive for infection.  Has leukocytosis of 27,000. -Continue IV fluids and Rocephin.  Trend lactic acid. -Urine culture is pending  Hypokalemia: Potassium 3.0 -Replenished.  Repeat BMP tomorrow a.m.  Hypomagnesemia: Replenished -Repeat  magnesium level tomorrow AM.  GERD: Continue PPI  Anxiety: Continue Klonopin  DVT prophylaxis: SCD Code Status: DNR  Family Communication:  None present at bedside.  Plan of care discussed with patient in length and he verbalized understanding and agreed with it. Disposition Plan: Home   Consultants:  Urology  Procedures:  Foley placement  Antimicrobials:  Rocephin  Status is: Observation   Dispo: The patient is from: Home              Anticipated d/c is to: Home              Patient currently is not medically stable to d/c.   Difficult to place patient No         Subjective: Patient seen and examined.  Resting comfortably on the bed, reports improvement in pain after Foley placement. No fever, chills, nausea or vomiting.  Objective: Vitals:   01/19/21 0600 01/19/21 0925 01/19/21 1227 01/19/21 1330  BP: 129/75 104/60 (!) 110/54 110/61  Pulse: 70 64 69 (!) 56  Resp: 18 16 (!) 21 18  Temp:      TempSrc:      SpO2: 96% 95% 97% 96%    Intake/Output Summary (Last 24 hours) at 01/19/2021 1416 Last data filed at 01/19/2021 0601 Gross per 24 hour  Intake --  Output 825 ml  Net -825 ml   There were no vitals filed for this visit.  Examination:  General exam: Appears calm and comfortable, on [place], communicating well Respiratory system: Clear to auscultation. Respiratory effort normal. Cardiovascular system: S1 & S2 heard, RRR. No JVD, murmurs, rubs, gallops or clicks. No pedal edema. Gastrointestinal system: Abdomen is nondistended, soft and nontender. No organomegaly or masses felt. Normal bowel sounds heard. Central nervous system: Alert and oriented. No focal neurological deficits. Extremities: Symmetric 5 x 5  power. Skin: No rashes, lesions or ulcers Psychiatry: Judgement and insight appear normal. Mood & affect appropriate.    Data Reviewed: I have personally reviewed following labs and imaging studies  CBC: Recent Labs  Lab 01/19/21 0030  01/19/21 1143  WBC 27.6* 15.8*  NEUTROABS 24.4* 12.3*  HGB 15.2 11.4*  HCT 43.7 34.6*  MCV 89.2 92.8  PLT 373 675   Basic Metabolic Panel: Recent Labs  Lab 01/19/21 0030 01/19/21 1143  NA 135 140  K 3.6 3.0*  CL 101 113*  CO2 21* 22  GLUCOSE 168* 100*  BUN 24* 17  CREATININE 1.79* 0.91  CALCIUM 10.2 7.1*  MG  --  1.6*   GFR: CrCl cannot be calculated (Unknown ideal weight.). Liver Function Tests: Recent Labs  Lab 01/19/21 0030 01/19/21 1143  AST 29 21  ALT 21 14  ALKPHOS 62 40  BILITOT 1.0 0.7  PROT 7.4 5.0*  ALBUMIN 4.3 2.8*   No results for input(s): LIPASE, AMYLASE in the last 168 hours. No results for input(s): AMMONIA in the last 168 hours. Coagulation Profile: No results for input(s): INR, PROTIME in the last 168 hours. Cardiac Enzymes: No results for input(s): CKTOTAL, CKMB, CKMBINDEX, TROPONINI in the last 168 hours. BNP (last 3 results) No results for input(s): PROBNP in the last 8760 hours. HbA1C: No results for input(s): HGBA1C in the last 72 hours. CBG: No results for input(s): GLUCAP in the last 168 hours. Lipid Profile: No results for input(s): CHOL, HDL, LDLCALC, TRIG, CHOLHDL, LDLDIRECT in the last 72 hours. Thyroid Function Tests: No results for input(s): TSH, T4TOTAL, FREET4, T3FREE, THYROIDAB in the last 72 hours. Anemia Panel: No results for input(s): VITAMINB12, FOLATE, FERRITIN, TIBC, IRON, RETICCTPCT in the last 72 hours. Sepsis Labs: Recent Labs  Lab 01/19/21 0146 01/19/21 1143  LATICACIDVEN 2.9* 1.0    Recent Results (from the past 240 hour(s))  Resp Panel by RT-PCR (Flu A&B, Covid) Urine, Clean Catch     Status: None   Collection Time: 01/19/21  1:05 AM   Specimen: Urine, Clean Catch; Nasopharyngeal(NP) swabs in vial transport medium  Result Value Ref Range Status   SARS Coronavirus 2 by RT PCR NEGATIVE NEGATIVE Final    Comment: (NOTE) SARS-CoV-2 target nucleic acids are NOT DETECTED.  The SARS-CoV-2 RNA is generally  detectable in upper respiratory specimens during the acute phase of infection. The lowest concentration of SARS-CoV-2 viral copies this assay can detect is 138 copies/mL. A negative result does not preclude SARS-Cov-2 infection and should not be used as the sole basis for treatment or other patient management decisions. A negative result may occur with  improper specimen collection/handling, submission of specimen other than nasopharyngeal swab, presence of viral mutation(s) within the areas targeted by this assay, and inadequate number of viral copies(<138 copies/mL). A negative result must be combined with clinical observations, patient history, and epidemiological information. The expected result is Negative.  Fact Sheet for Patients:  EntrepreneurPulse.com.au  Fact Sheet for Healthcare Providers:  IncredibleEmployment.be  This test is no t yet approved or cleared by the [place] FDA and  has been authorized for detection and/or diagnosis of SARS-CoV-2 by FDA under an Emergency Use Authorization (EUA). This EUA will remain  in effect (meaning this test can be used) for the duration of the COVID-19 declaration under Section 564(b)(1) of the Act, 21 U.S.C.section 360bbb-3(b)(1), unless the authorization is terminated  or revoked sooner.       Influenza A by PCR NEGATIVE NEGATIVE Final  Influenza B by PCR NEGATIVE NEGATIVE Final    Comment: (NOTE) The Xpert Xpress SARS-CoV-2/FLU/RSV plus assay is intended as an aid in the diagnosis of influenza from Nasopharyngeal swab specimens and should not be used as a sole basis for treatment. Nasal washings and aspirates are unacceptable for Xpert Xpress SARS-CoV-2/FLU/RSV testing.  Fact Sheet for Patients: EntrepreneurPulse.com.au  Fact Sheet for Healthcare Providers: IncredibleEmployment.be  This test is not yet approved or cleared by the [place] FDA  and has been authorized for detection and/or diagnosis of SARS-CoV-2 by FDA under an Emergency Use Authorization (EUA). This EUA will remain in effect (meaning this test can be used) for the duration of the COVID-19 declaration under Section 564(b)(1) of the Act, 21 U.S.C. section 360bbb-3(b)(1), unless the authorization is terminated or revoked.  Performed at [facility], [place] [address], [place], [place] [address]       Radiology Studies: CT ABDOMEN PELVIS WO CONTRAST  Result Date: 01/18/2021 CLINICAL DATA:  87 year old male with acute abdominal pain. EXAM: CT ABDOMEN AND PELVIS WITHOUT CONTRAST TECHNIQUE: Multidetector CT imaging of the abdomen and pelvis was performed following the standard protocol without IV contrast. COMPARISON:  Chest radiograph dated 04/10/2016. FINDINGS: Evaluation of this exam is limited in the absence of intravenous contrast. Lower chest: There are bibasilar linear atelectasis/scarring. The visualized lung bases are otherwise clear. No intra-abdominal free air or free fluid. Hepatobiliary: No focal liver abnormality is seen. No gallstones, gallbladder wall thickening, or biliary dilatation. Pancreas: Unremarkable. No pancreatic ductal dilatation or surrounding inflammatory changes. Spleen: Normal in size without focal abnormality. Adrenals/Urinary Tract: The adrenal glands are unremarkable. There is a 3 mm high attenuating focus along the left posterior bladder wall adjacent to the left UVJ which may be artifactual or represent a recently passed stone or left UVJ calculus. There is mild left hydronephrosis. Mild left perinephric stranding. Correlation with urinalysis recommended to exclude superimposed UTI. The right kidney, and the right ureter and urinary bladder appear unremarkable. Stomach/Bowel: There is sigmoid diverticulosis without active inflammatory changes. There is moderate stool throughout the colon. There is a small hiatal hernia. There  is no bowel obstruction or active inflammation. The appendix is normal. Vascular/Lymphatic: Advanced aortoiliac atherosclerotic disease. The IVC is unremarkable. No portal venous gas. There is no adenopathy. Reproductive: Prostate brachytherapy seeds. Other: None Musculoskeletal: Degenerative changes of the spine. No acute osseous pathology. IMPRESSION: 1. A 3 mm recently passed stone or left UVJ calculus with mild left hydronephrosis. Correlation with urinalysis recommended to exclude superimposed UTI. 2. Sigmoid diverticulosis. No bowel obstruction. Normal appendix. 3. Aortic Atherosclerosis (ICD10-I70.0). Electronically Signed   By: Anner Crete M.D.   On: 01/18/2021 23:55    Scheduled Meds:  ascorbic acid  1,000 mg Oral Daily   calcium-vitamin D  2 tablet Oral Q breakfast   clonazePAM  0.5 mg Oral QHS   omega-3 acid ethyl esters  2,000 mg Oral BID   pantoprazole  40 mg Oral Daily   Continuous Infusions:  sodium chloride 75 mL/hr at 01/19/21 1105   cefTRIAXone (ROCEPHIN)  IV Stopped (01/19/21 0253)     LOS: 0 days   Time spent: 35 minutes   Devlynn Knoff Loann Quill, MD [facility]  If 7PM-7AM, please contact night-coverage [email or web address] 01/19/2021, 2:16 PM

## 2021-01-19 NOTE — Procedures (Signed)
Foley Catheter Placement Note  Indications: 87 y.o. male with history of prostate cancer s/p radiation and with dystrophic prostatic calcifications and histoyr of difficult foley catheter placement presenting with acute urinary retention  Pre-operative Diagnosis: Urinary retention  Post-operative Diagnosis: Same  Surgeon: Bishop Limbo, MD  Assistants: None  Procedure Details  Patient was placed in the supine position, prepped with Betadine and draped in the usual sterile fashion.  We injected lidocaine jelly per urethra prior to the procedure. A 17Fr flexible cystoscope was inserted per urethra with copious lubrication running. The urehta proximally was friable consistent with prior radiation. Sphincter was intact. He has kissing lobes of the prostate with significant friable tissue and scar tissue. I was able to follow the urethral lumen towards the left side of the prostatic urethra and advance the cystoscope easy through his narrow baldder neck into the bladder. The bladder was signficantly distended. I did not inspect it closely due to patient discomfort. I passed a sensor wire through the scope and removed the scope in push pull manner.  We then inserted a 44 [place] council tip catheter per urethra which easily passed into the bladder without any resistance.  We achieved return of clear yellow urine and the wire was removed and then proceeded to insert 10 mL of sterile water into the Foley balloon.  The catheter was attached to a drainage bag and secured with a StatLock.                 Complications: None; patient tolerated the procedure well.  Plan:   1.  Continue Foley catheter to drainage for now 2. Document how much urine empties immediately 3. Give ceftriaxone for urinary manipulation and empiric UTI 4. Admit for presumed UTI treatment (WBC 27) and monitoring for post-obstructive diuresis 5. We will continue to follow and arrange his follow up.   Attending Attestation: Dr. Abner Greenspan  was available.  Bishop Limbo, MD [facility]

## 2021-01-19 NOTE — Consult Note (Signed)
Urology Consult Note   Requesting Attending Physician:  No att. providers found Service Providing Consult: Urology  Consulting Attending: Rexene Alberts, MD   Reason for Consult:  Urinary retention, difficult foley  HPI: Glen Henry is seen in consultation for reasons noted above at the request of \\Lisa  Sanders for evaluation of urinary retention. He has a long urologic history .  treated for prostate cancer in 2009 with a radiation seed implant. Following this surgery or procedure he has had significant lower urinary tract symptoms. He was treated with a number of different agents and including Toviaz and VESIcare and myrbetriq. Had urinary retention and underwent cystoscopy and foley placement in the OR with Dr. Louis Meckel twice, most recently 06/07/20 during which he performed laser ablation of a narrow bladder neck and dystrophic calcifications of the prostate.   He is uncomfortabler and nurses and ER physicians were unable to catheterize him three times. He is afebrile and HDS.Leukocytosis to 27.6. Creatinein is 1.8 from baseline 0.8.  UA with moderate LE . CT scan revealed distended bladder and possibly a passed stone within the bladder versus a very distal left UVJ stone.   I was able to cystoscopically place a urinary catheter as dictated separately in my procedure note. Very difficult foley placement. Required OR trips twice in the past. 825 cc emptied upon plaecment of foley.   Past Medical History: Past Medical History:  Diagnosis Date   Anxiety    Arthritis    Benign prostate hyperplasia    Cancer ([place]) 2019   history of prostate cancer   Esophageal reflux    H/O melanoma excision    Excised in 1988   H/O prostate cancer    Seed implantation   Hypertension     Past Surgical History:  Past Surgical History:  Procedure Laterality Date   CYSTOSCOPY N/A 05/01/2020   Procedure: CYSTOSCOPY catheter placement;  Surgeon: Ardis Hughs, MD;  Location: [facility];  Service:  Urology;  Laterality: N/A;   INGUINAL HERNIA REPAIR     Bilaterally   PROSTATE SURGERY  2009   removal of melanoma   1988   removed from back   [place] N/A 06/07/2020   Procedure: LASER TRANSURETHRAL RESECTION OF THE PROSTATE (TURP) HOLMIUM;  Surgeon: Ardis Hughs, MD;  Location: [facility];  Service: Urology;  Laterality: N/A;    Medication: Current Facility-Administered Medications  Medication Dose Route Frequency Provider Last Rate Last Admin   cefTRIAXone (ROCEPHIN) 2 g in sodium chloride 0.9 % 100 mL IVPB  2 g Intravenous Q24H Larene Pickett, PA-C       Current Outpatient Medications  Medication Sig Dispense Refill   Ascorbic Acid (VITAMIN C) 500 MG CAPS Take 1,000 mg by mouth daily.      aspirin 325 MG tablet Take 325 mg by mouth daily.     calcium-vitamin D (OSCAL WITH D) 500-200 MG-UNIT per tablet Take 2 tablets by mouth daily with breakfast.      ciprofloxacin (CIPRO) 500 MG tablet Take 500 mg by mouth 2 (two) times daily. 01/09/21     clonazePAM (KLONOPIN) 0.5 MG tablet Take 0.5 mg by mouth at bedtime.      fish oil-omega-3 fatty acids 1000 MG capsule Take 2,000 mg by mouth 2 (two) times daily.     lisinopril (ZESTRIL) 20 MG tablet Take 20 mg by mouth daily.     pantoprazole (PROTONIX) 40 MG tablet Take 40 mg by mouth daily.  traMADol (ULTRAM) 50 MG tablet Take 1-2 tablets (50-100 mg total) by mouth every 6 (six) hours as needed for moderate pain. 10 tablet 0    Allergies: Allergies  Allergen Reactions   Bee Venom Other (See Comments)    Swelling at site of sting    Codeine Nausea And Vomiting   Elemental Sulfur Other (See Comments)    unknown   Statins     aches   Sulfa Antibiotics    Welchol [Colesevelam Hcl]     aches    Social History: Social History   Tobacco Use   Smoking status: Former    Types: Cigarettes    Quit date: 10/22/1971    Years since quitting: 49.2   Smokeless tobacco: Never  Vaping Use   Vaping Use:  Never used  Substance Use Topics   Alcohol use: No   Drug use: No    Family History Family History  Problem Relation Age of Onset   Coronary artery disease Other    Stroke Other     Review of Systems 10 systems were reviewed and are negative except as noted specifically in the HPI.  Objective   Vital signs in last 24 hours: BP (!) 156/74   Pulse 78   Temp 99 F (37.2 C) (Oral)   Resp 18   SpO2 94%   Physical Exam General: NAD, A&O, resting, appropriate HEENT: [place]/AT, EOMI, MMM Pulmonary: Normal work of breathing Cardiovascular: HDS, adequate peripheral perfusion Abdomen: Soft, suprapubic tenderness and distention prior to catheter placement. GU: After foley placed, draining clear light pink urine.  Extremities: warm and well perfused Neuro: Appropriate, no focal neurological deficits  Most Recent Labs: Lab Results  Component Value Date   WBC 27.6 (H) 01/19/2021   HGB 15.2 01/19/2021   HCT 43.7 01/19/2021   PLT 373 01/19/2021    Lab Results  Component Value Date   NA 135 01/19/2021   K 3.6 01/19/2021   CL 101 01/19/2021   CO2 21 (L) 01/19/2021   BUN 24 (H) 01/19/2021   CREATININE 1.79 (H) 01/19/2021   CALCIUM 10.2 01/19/2021    Lab Results  Component Value Date   INR 0.9 06/08/2008   APTT 36 06/08/2008     Urine Culture: @[id number] (laburin,org,[id number])@   IMAGING: CT ABDOMEN PELVIS WO CONTRAST  Result Date: 01/18/2021 CLINICAL DATA:  87 year old male with acute abdominal pain. EXAM: CT ABDOMEN AND PELVIS WITHOUT CONTRAST TECHNIQUE: Multidetector CT imaging of the abdomen and pelvis was performed following the standard protocol without IV contrast. COMPARISON:  Chest radiograph dated 04/10/2016. FINDINGS: Evaluation of this exam is limited in the absence of intravenous contrast. Lower chest: There are bibasilar linear atelectasis/scarring. The visualized lung bases are otherwise clear. No intra-abdominal free air or free fluid. Hepatobiliary: No  focal liver abnormality is seen. No gallstones, gallbladder wall thickening, or biliary dilatation. Pancreas: Unremarkable. No pancreatic ductal dilatation or surrounding inflammatory changes. Spleen: Normal in size without focal abnormality. Adrenals/Urinary Tract: The adrenal glands are unremarkable. There is a 3 mm high attenuating focus along the left posterior bladder wall adjacent to the left UVJ which may be artifactual or represent a recently passed stone or left UVJ calculus. There is mild left hydronephrosis. Mild left perinephric stranding. Correlation with urinalysis recommended to exclude superimposed UTI. The right kidney, and the right ureter and urinary bladder appear unremarkable. Stomach/Bowel: There is sigmoid diverticulosis without active inflammatory changes. There is moderate stool throughout the colon. There is a small hiatal hernia. There is  no bowel obstruction or active inflammation. The appendix is normal. Vascular/Lymphatic: Advanced aortoiliac atherosclerotic disease. The IVC is unremarkable. No portal venous gas. There is no adenopathy. Reproductive: Prostate brachytherapy seeds. Other: None Musculoskeletal: Degenerative changes of the spine. No acute osseous pathology. IMPRESSION: 1. A 3 mm recently passed stone or left UVJ calculus with mild left hydronephrosis. Correlation with urinalysis recommended to exclude superimposed UTI. 2. Sigmoid diverticulosis. No bowel obstruction. Normal appendix. 3. Aortic Atherosclerosis (ICD10-I70.0). Electronically Signed   By: Anner Crete M.D.   On: 01/18/2021 23:55    ------  Assessment:  87 y.o. male with history of prostate seed radiation, difficult foley placements due to dystrophic calcifications and bladder neck contracture, LUTS presenting with recurrent urinary retention.   Leukocytosis to 27. AKI to 1.79 from baseline 0.8. HDS.  UA with moderate LE . CT scan revealed distended bladder and possibly a passed stone within the  bladder versus a very distal left UVJ stone.   I was able to cystoscopically place a urinary catheter as dictated separately in my procedure note.  It is a difficult catheter to place even cystoscopically.  The true lumen of the urethra is located towards the left superior portion of the prostate and may be difficult to visualize.  I have placed a council catheter so that in the event that he is catheter dependent now his catheter can be exchanged periodically easily over the wire.  The opacity appears to be in dependent portion of the bladder versus outside of the bladder / part of calcifications. I would prefer to monitor the patient's creatinine and repeat a renal ultrasound to ensure resolution of mild left hydronephrosis. If hydronephrosis does not improve with catheter in place or if patient becomes hemodynamically unstable,m we would re-evaluate for possible stone. However, it appears to have passed into the bladder.   Recommendations: 1.Continue foley catheter to drainage. Consider renal ultrasound 24-48 hours to ensure resolution / improvement of hydronephrosis 2. Will arrange for outpatient follow up with Dr. Louis Meckel or available provider in 1 week to discuss bladder management options. TOV vs catheter dependence given difficult foley placement.  3. Admit to medicine for presumed UTI treatment (WBC 27) and monitoring for post-obstructive diuresis   Thank you for this consult. Please contact the urology consult pager with any further questions/concerns.

## 2021-01-20 ENCOUNTER — Observation Stay (HOSPITAL_COMMUNITY): Payer: PPO

## 2021-01-20 DIAGNOSIS — N3000 Acute cystitis without hematuria: Secondary | ICD-10-CM | POA: Diagnosis present

## 2021-01-20 DIAGNOSIS — N1330 Unspecified hydronephrosis: Secondary | ICD-10-CM | POA: Diagnosis not present

## 2021-01-20 DIAGNOSIS — R339 Retention of urine, unspecified: Secondary | ICD-10-CM | POA: Diagnosis not present

## 2021-01-20 DIAGNOSIS — N179 Acute kidney failure, unspecified: Secondary | ICD-10-CM | POA: Diagnosis not present

## 2021-01-20 LAB — BASIC METABOLIC PANEL
Anion gap: 8 (ref 5–15)
BUN: 18 mg/dL (ref 8–23)
CO2: 26 mmol/L (ref 22–32)
Calcium: 9.1 mg/dL (ref 8.9–10.3)
Chloride: 106 mmol/L (ref 98–111)
Creatinine, Ser: 0.98 mg/dL (ref 0.61–1.24)
GFR, Estimated: >60 mL/min (ref >60)
Glucose, Bld: 105 mg/dL — ABNORMAL HIGH (ref 70–99)
Potassium: 4.3 mmol/L (ref 3.5–5.1)
Sodium: 140 mmol/L (ref 135–145)

## 2021-01-20 LAB — CBC WITH DIFFERENTIAL/PLATELET
Abs Immature Granulocytes: 0.06 10*3/uL (ref 0.00–0.07)
Basophils Absolute: 0.1 10*3/uL (ref 0.0–0.1)
Basophils Relative: 1 %
Eosinophils Absolute: 0.4 10*3/uL (ref 0.0–0.5)
Eosinophils Relative: 3 %
HCT: 41.9 % (ref 39.0–52.0)
Hemoglobin: 13.7 g/dL (ref 13.0–17.0)
Immature Granulocytes: 0 %
Lymphocytes Relative: 28 %
Lymphs Abs: 4.1 10*3/uL — ABNORMAL HIGH (ref 0.7–4.0)
MCH: 30.1 pg (ref 26.0–34.0)
MCHC: 32.7 g/dL (ref 30.0–36.0)
MCV: 92.1 fL (ref 80.0–100.0)
Monocytes Absolute: 1.3 10*3/uL — ABNORMAL HIGH (ref 0.1–1.0)
Monocytes Relative: 9 %
Neutro Abs: 8.7 10*3/uL — ABNORMAL HIGH (ref 1.7–7.7)
Neutrophils Relative %: 59 %
Platelets: 274 10*3/uL (ref 150–400)
RBC: 4.55 MIL/uL (ref 4.22–5.81)
RDW: 14.3 % (ref 11.5–15.5)
WBC: 14.6 10*3/uL — ABNORMAL HIGH (ref 4.0–10.5)
nRBC: 0 % (ref 0.0–0.2)

## 2021-01-20 LAB — URINE CULTURE: Culture: NO GROWTH

## 2021-01-20 LAB — MAGNESIUM: Magnesium: 2.1 mg/dL (ref 1.7–2.4)

## 2021-01-20 MED ORDER — MENTHOL 3 MG MT LOZG
1.0000 | LOZENGE | OROMUCOSAL | Status: DC | PRN
  Filled 2021-01-20: qty 9

## 2021-01-20 MED ORDER — DIAZEPAM 5 MG PO TABS
5.0000 mg | ORAL_TABLET | Freq: Once | ORAL | Status: DC | PRN
  Filled 2021-01-20: qty 1

## 2021-01-20 MED ORDER — CEPHALEXIN 500 MG PO CAPS: 500.0000 mg | ORAL_CAPSULE | Freq: Four times a day (QID) | ORAL | 0 refills | Status: AC

## 2021-01-20 MED ORDER — MENTHOL 3 MG MT LOZG: 1.0000 | LOZENGE | OROMUCOSAL | Status: DC | PRN

## 2021-01-20 NOTE — Discharge Instructions (Addendum)
You were cared for by a hospitalist during your hospital stay. If you have any questions about your discharge medications or the care you received while you were in the hospital after you are discharged, you can call the unit and ask to speak with the hospitalist on call if the hospitalist that took care of you is not available. Once you are discharged, your primary care physician will handle any further medical issues. Please note that NO REFILLS for any discharge medications will be authorized once you are discharged, as it is imperative that you return to your primary care physician (or establish a relationship with a primary care physician if you do not have one) for your aftercare needs so that they can reassess your need for medications and monitor your lab values.  Call Dr. Gwynneth Aliment office [facility] [phone] [address] [place], [place] [address]

## 2021-01-20 NOTE — Progress Notes (Signed)
AVS given to patient, reviewed discharge instructions w/ pt, no further questions. Pt picked up by wife to go home.

## 2021-01-20 NOTE — Progress Notes (Signed)
Patient discharging home with spouse this evening. Belongings were returned. Medication education will be provided.

## 2021-01-20 NOTE — Progress Notes (Deleted)
PROGRESS NOTE    Glen Henry  XVQ:[id number] DOB: Mar 13, 1931 DOA: 01/18/2021 PCP: Mayra Neer, MD     Brief Narrative:  Patient is an 87 year old male with a history of prostate cancer, hypertension, GERD, and intermittent urinary retention with previous difficult Foley catheter placement who presented on 7/14 for lower abdominal pain.  He was found to have acute kidney injury secondary to retention in the ER cannot get a Foley catheter in.  Urology did it under cystoscopy.  He was also started on Rocephin for presumed UTI.   New events last 24 hours / Subjective: Urology placed Foley yesterday evening.  Recommended repeat ultrasound in 24 to 48 hours.  Patient reports feeling good.  He denies any fevers, abdominal pain, nausea, or vomiting.  White count greatly improved from 27-14.6.  Assessment & Plan:   Principal Problem:   AKI (acute kidney injury) [facility])  [facility], will leave Foley in place  Reck Renal US this afternoon  Monitor BMP  IVF's NS 75 mL/hr  Active Problems:   Urinary retention  As above    GERD without esophagitis  Protonix 40 mg/d    Complicated UTI (urinary tract infection)  Cont Rocephin 2 g daily  Urine/blood cx pending    Lactic acidosis  Resolved    Anxiety disorder  Klonopin prn  DVT prophylaxis: SCDs Code Status: DNR Family Communication: Self Coming From: Home Disposition Plan: Home Barriers to Discharge: Needs repeat US  Consultants:  Urology  Procedures:  Cystoscopy guided Foley insertion  Antimicrobials:  Anti-infectives (From admission, onward)    Start     Dose/Rate Route Frequency Ordered Stop   01/20/21 0200  cefTRIAXone (ROCEPHIN) 2 g in sodium chloride 0.9 % 100 mL IVPB        2 g 200 mL/hr over 30 Minutes Intravenous Every 24 hours 01/19/21 1701     01/19/21 0115  cefTRIAXone (ROCEPHIN) 2 g in sodium chloride 0.9 % 100 mL IVPB  Status:  Discontinued        2 g 200 mL/hr over 30 Minutes Intravenous Every  24 hours 01/19/21 0103 01/19/21 1701        Objective: Vitals:   01/19/21 1719 01/19/21 2101 01/20/21 0107 01/20/21 0453  BP:  119/64 (!) 141/66 132/65  Pulse:  (!) 57 (!) 48 (!) 51  Resp:  20 20 16   Temp:  98.2 F (36.8 C) 97.8 F (36.6 C) 98.1 F (36.7 C)  TempSrc:  Oral Oral Oral  SpO2:  95% 97% 96%  Weight: 70.3 kg     Height: 5\' 9"  (1.753 m)       Intake/Output Summary (Last 24 hours) at 01/20/2021 1136 Last data filed at 01/20/2021 0625 Gross per 24 hour  Intake 489.74 ml  Output 1100 ml  Net -610.26 ml   Filed Weights   01/19/21 1719  Weight: 70.3 kg    Examination:  General exam: Appears calm and comfortable  Respiratory system: Clear to auscultation. Respiratory effort normal. No respiratory distress. No conversational dyspnea.  Cardiovascular system: S1 & S2 heard, RRR. No murmurs. No pedal edema. Gastrointestinal system: Abdomen is nondistended, soft and nontender. Normal bowel sounds heard. Skin: No rashes, lesions or ulcers on exposed skin  Psychiatry: Judgement and insight appear normal. Mood & affect appropriate.   Data Reviewed: I have personally reviewed following labs and imaging studies  CBC: Recent Labs  Lab 01/19/21 0030 01/19/21 1143 01/20/21 0513  WBC 27.6* 15.8* 14.6*  NEUTROABS 24.4* 12.3* 8.7*  HGB 15.2 11.4* 13.7  HCT 43.7 34.6* 41.9  MCV 89.2 92.8 92.1  PLT 373 247 277   Basic Metabolic Panel: Recent Labs  Lab 01/19/21 0030 01/19/21 1143 01/20/21 0513  NA 135 140 140  K 3.6 3.0* 4.3  CL 101 113* 106  CO2 21* 22 26  GLUCOSE 168* 100* 105*  BUN 24* 17 18  CREATININE 1.79* 0.91 0.98  CALCIUM 10.2 7.1* 9.1  MG  --  1.6* 2.1   GFR: Estimated Creatinine Clearance: 52.8 mL/min (by C-G formula based on SCr of 0.98 mg/dL).  Liver Function Tests: Recent Labs  Lab 01/19/21 0030 01/19/21 1143  AST 29 21  ALT 21 14  ALKPHOS 62 40  BILITOT 1.0 0.7  PROT 7.4 5.0*  ALBUMIN 4.3 2.8*   Sepsis Labs: Recent Labs  Lab  01/19/21 0146 01/19/21 1143  LATICACIDVEN 2.9* 1.0    Recent Results (from the past 240 hour(s))  Urine Culture     Status: None   Collection Time: 01/19/21  1:00 AM   Specimen: Urine, Catheterized  Result Value Ref Range Status   Specimen Description   Final    URINE, CATHETERIZED Performed at [facility], [place] [address] [place], [place] [address]    Special Requests   Final    NONE Performed at [facility], [place] [address], [place], [place] [address]    Culture   Final    NO GROWTH Performed at [facility], [place] [address], [place], [place] [address]    Report Status 01/20/2021 FINAL  Final  Resp Panel by RT-PCR (Flu A&B, Covid) Urine, Clean Catch     Status: None   Collection Time: 01/19/21  1:05 AM   Specimen: Urine, Clean Catch; Nasopharyngeal(NP) swabs in vial transport medium  Result Value Ref Range Status   SARS Coronavirus 2 by RT PCR NEGATIVE NEGATIVE Final    Comment: (NOTE) SARS-CoV-2 target nucleic acids are NOT DETECTED.  The SARS-CoV-2 RNA is generally detectable in upper respiratory specimens during the acute phase of infection. The lowest concentration of SARS-CoV-2 viral copies this assay can detect is 138 copies/mL. A negative result does not preclude SARS-Cov-2 infection and should not be used as the sole basis for treatment or other patient management decisions. A negative result may occur with  improper specimen collection/handling, submission of specimen other than nasopharyngeal swab, presence of viral mutation(s) within the areas targeted by this assay, and inadequate number of viral copies(<138 copies/mL). A negative result must be combined with clinical observations, patient history, and epidemiological information. The expected result is Negative.  Fact Sheet for Patients:  EntrepreneurPulse.com.au  Fact Sheet for Healthcare Providers:   IncredibleEmployment.be  This test is no t yet approved or cleared by the [place] FDA and  has been authorized for detection and/or diagnosis of SARS-CoV-2 by FDA under an Emergency Use Authorization (EUA). This EUA will remain  in effect (meaning this test can be used) for the duration of the COVID-19 declaration under Section 564(b)(1) of the Act, 21 U.S.C.section 360bbb-3(b)(1), unless the authorization is terminated  or revoked sooner.       Influenza A by PCR NEGATIVE NEGATIVE Final   Influenza B by PCR NEGATIVE NEGATIVE Final    Comment: (NOTE) The Xpert Xpress SARS-CoV-2/FLU/RSV plus assay is intended as an aid in the diagnosis of influenza from Nasopharyngeal swab specimens and should not be used as a sole basis for treatment. Nasal washings and aspirates are unacceptable  for Xpert Xpress SARS-CoV-2/FLU/RSV testing.  Fact Sheet for Patients: EntrepreneurPulse.com.au  Fact Sheet for Healthcare Providers: IncredibleEmployment.be  This test is not yet approved or cleared by the [place] FDA and has been authorized for detection and/or diagnosis of SARS-CoV-2 by FDA under an Emergency Use Authorization (EUA). This EUA will remain in effect (meaning this test can be used) for the duration of the COVID-19 declaration under Section 564(b)(1) of the Act, 21 U.S.C. section 360bbb-3(b)(1), unless the authorization is terminated or revoked.  Performed at [facility], [place] [address], [place], [place] [address]   MRSA Next Gen by PCR, Nasal     Status: None   Collection Time: 01/19/21  5:26 PM   Specimen: Nasal Mucosa; Nasal Swab  Result Value Ref Range Status   MRSA by PCR Next Gen NOT DETECTED NOT DETECTED Final    Comment: (NOTE) The GeneXpert MRSA Assay (FDA approved for NASAL specimens only), is one component of a comprehensive MRSA colonization surveillance program. It is not intended  to diagnose MRSA infection nor to guide or monitor treatment for MRSA infections. Test performance is not FDA approved in patients less than 54 years old. Performed at [facility], [place] [address], [place], [place] [address]       Radiology Studies: CT ABDOMEN PELVIS WO CONTRAST  Result Date: 01/18/2021 CLINICAL DATA:  87 year old male with acute abdominal pain. EXAM: CT ABDOMEN AND PELVIS WITHOUT CONTRAST TECHNIQUE: Multidetector CT imaging of the abdomen and pelvis was performed following the standard protocol without IV contrast. COMPARISON:  Chest radiograph dated 04/10/2016. FINDINGS: Evaluation of this exam is limited in the absence of intravenous contrast. Lower chest: There are bibasilar linear atelectasis/scarring. The visualized lung bases are otherwise clear. No intra-abdominal free air or free fluid. Hepatobiliary: No focal liver abnormality is seen. No gallstones, gallbladder wall thickening, or biliary dilatation. Pancreas: Unremarkable. No pancreatic ductal dilatation or surrounding inflammatory changes. Spleen: Normal in size without focal abnormality. Adrenals/Urinary Tract: The adrenal glands are unremarkable. There is a 3 mm high attenuating focus along the left posterior bladder wall adjacent to the left UVJ which may be artifactual or represent a recently passed stone or left UVJ calculus. There is mild left hydronephrosis. Mild left perinephric stranding. Correlation with urinalysis recommended to exclude superimposed UTI. The right kidney, and the right ureter and urinary bladder appear unremarkable. Stomach/Bowel: There is sigmoid diverticulosis without active inflammatory changes. There is moderate stool throughout the colon. There is a small hiatal hernia. There is no bowel obstruction or active inflammation. The appendix is normal. Vascular/Lymphatic: Advanced aortoiliac atherosclerotic disease. The IVC is unremarkable. No portal venous gas. There is no  adenopathy. Reproductive: Prostate brachytherapy seeds. Other: None Musculoskeletal: Degenerative changes of the spine. No acute osseous pathology. IMPRESSION: 1. A 3 mm recently passed stone or left UVJ calculus with mild left hydronephrosis. Correlation with urinalysis recommended to exclude superimposed UTI. 2. Sigmoid diverticulosis. No bowel obstruction. Normal appendix. 3. Aortic Atherosclerosis (ICD10-I70.0). Electronically Signed   By: Anner Crete M.D.   On: 01/18/2021 23:55     Scheduled Meds:  ascorbic acid  1,000 mg Oral Daily   calcium-vitamin D  2 tablet Oral Q breakfast   Chlorhexidine Gluconate Cloth  6 each Topical Daily   clonazePAM  0.5 mg Oral QHS   omega-3 acid ethyl esters  2,000 mg Oral BID   pantoprazole  40 mg Oral Daily   Continuous Infusions:  sodium chloride 75 mL/hr at 01/20/21 0844  cefTRIAXone (ROCEPHIN)  IV 2 g (01/20/21 0244)     LOS: 0 days    Time spent: 26 minutes   Shelda Pal, DO [facility] 01/20/2021, 11:36 AM   Available via Epic secure chat 7am-7pm After these hours, please refer to coverage provider listed on [email or web address]

## 2021-01-20 NOTE — Progress Notes (Addendum)
Foley catheter clamped at this time for ultrasound on the bladder in one hour per ultrasound. Will unclamp foley after ultrasound.

## 2021-01-20 NOTE — Progress Notes (Signed)
Patient's foley unclamp and draining well.

## 2021-01-20 NOTE — Discharge Summary (Signed)
Physician Discharge Summary  Glen Henry JSH:[id number] DOB: 12/10/1930 DOA: 01/18/2021  PCP: Mayra Neer, MD  Admit date: 01/18/2021 Discharge date: 01/20/2021  Admitted From: Home Disposition:  Home  Recommendations for Outpatient Follow-up:  Follow up with PCP in 1 week Follow up with urology in 1 week; Dr. Rexene Alberts saw pt in hospital; [phone]; [place] Please obtain BMP/CBC in 1 week Please follow up on the following pending results: Blood cultures  Home Health: none  Equipment/Devices: none   Discharge Condition: Good CODE STATUS: DNR  Diet recommendation: Low sodium  Brief/Interim Summary: From H&P by Dr. Inda Merlin: "Glen Henry with past medical history of prostate cancer (Dx 2011 S/P radiation therapy), hypertension, gastroesophageal reflux disease and bouts of urinary retention with difficult Foley catheter placement in the past who presented to [facility] emergency department with complaints of lower abdominal pain.  Patient explains that early in the afternoon on 7/14 realized that he could no longer urinate.  From that point onward, the patient began to develop progressively worsening lower abdominal and penile pain.  Patient describes his pain as sharp in quality, 10 out of 10 in intensity, nonradiating, worse with movement or attempts to urinate.  Patient complains of associated nausea and several bouts of nonbilious nonbloody vomiting.  Patient denies any diarrhea or fever.  Patient denies any sick contacts recent travel or confirmed contact with COVID-19 infection.  Patient's abdominal pain and inability urinate continued to persist until the early morning of 7/15 after which the patient presented to [facility] emergency department for evaluation.  Upon evaluation in the emergency department multiple times and Foley catheter placement using coud catheters by the ER staff failed.  Urology was contacted and  promptly came to evaluate the patient and performed a cystoscope assisted catheter placement successfully.  Of note, patient was also noted to have a markedly elevated white blood cell count at 27.6 and evidence of mild acute kidney injury and therefore patient was initiated on intravenous fluids and intravenous ceftriaxone as well.  Urology recommended hospitalization under the medicine service for close monitoring of renal function and treatment of suspected urinary tract infection."  Interim: Patient is a cystoscopy guided Foley placement that did not relieve much of the symptoms.  White count improved while on Rocephin.  Follow-up ultrasound showed no hydronephrosis.  Subjective on day of discharge: Patient reports feeling good, comfortable with the plan.  He will follow-up with urology this week.  The catheter will be left in place.  Discharge Diagnoses:  Principal Problem:   AKI (acute kidney injury) ([place]) Active Problems:   Urinary retention   GERD without esophagitis   Complicated UTI (urinary tract infection)   Lactic acidosis   Anxiety disorder   Acute cystitis  Principal Problem:   AKI (acute kidney injury) [facility])             [facility], will leave Foley in place on DC             Renal US reassuring             Monitor BMP outpt             IVF's to be d/c'd   Active Problems:   Urinary retention             As above     GERD without esophagitis             PPI  Complicated UTI (urinary tract infection)             Change to Keflex on outpatient basis             Urine cx neg but will finish abx  Blood cultures pending on dc     Lactic acidosis             Resolved     Anxiety disorder             Klonopin prn  Discharge Instructions  Discharge Instructions     Call MD for:  temperature >100.4   Complete by: As directed    Diet - low sodium heart healthy   Complete by: As directed    Increase activity slowly   Complete by: As directed        Allergies as of 01/20/2021       Reactions   Bee Venom Other (See Comments)   Swelling at site of sting   Codeine Nausea And Vomiting   Elemental Sulfur Other (See Comments)   unknown   Statins    aches   Sulfa Antibiotics    Welchol [colesevelam Hcl]    aches        Medication List     STOP taking these medications    ciprofloxacin 500 MG tablet Commonly known as: CIPRO       TAKE these medications    aspirin 325 MG tablet Take 325 mg by mouth daily.   calcium-vitamin D 500-200 MG-UNIT tablet Commonly known as: OSCAL WITH D Take 2 tablets by mouth daily with breakfast.   cephALEXin 500 MG capsule Commonly known as: KEFLEX Take 1 capsule (500 mg total) by mouth 4 (four) times daily for 7 days. Start taking on: January 21, 2021   clonazePAM 0.5 MG tablet Commonly known as: KLONOPIN Take 0.5 mg by mouth at bedtime.   fish oil-omega-3 fatty acids 1000 MG capsule Take 2,000 mg by mouth 2 (two) times daily.   lisinopril 20 MG tablet Commonly known as: ZESTRIL Take 20 mg by mouth daily.   pantoprazole 40 MG tablet Commonly known as: PROTONIX Take 40 mg by mouth daily.   traMADol 50 MG tablet Commonly known as: Ultram Take 1-2 tablets (50-100 mg total) by mouth every 6 (six) hours as needed for moderate pain.   Vitamin C 500 MG Caps Take 1,000 mg by mouth daily.        Allergies  Allergen Reactions   Bee Venom Other (See Comments)    Swelling at site of sting    Codeine Nausea And Vomiting   Elemental Sulfur Other (See Comments)    unknown   Statins     aches   Sulfa Antibiotics    Welchol [Colesevelam Hcl]     aches    Consultations: Urology   Procedures/Studies: CT ABDOMEN PELVIS WO CONTRAST  Result Date: 01/18/2021 CLINICAL DATA:  Glen Henry with acute abdominal pain. EXAM: CT ABDOMEN AND PELVIS WITHOUT CONTRAST TECHNIQUE: Multidetector CT imaging of the abdomen and pelvis was performed following the standard protocol without  IV contrast. COMPARISON:  Chest radiograph dated 04/10/2016. FINDINGS: Evaluation of this exam is limited in the absence of intravenous contrast. Lower chest: There are bibasilar linear atelectasis/scarring. The visualized lung bases are otherwise clear. No intra-abdominal free air or free fluid. Hepatobiliary: No focal liver abnormality is seen. No gallstones, gallbladder wall thickening, or biliary dilatation. Pancreas: Unremarkable. No pancreatic ductal dilatation or surrounding inflammatory changes. Spleen: Normal  in size without focal abnormality. Adrenals/Urinary Tract: The adrenal glands are unremarkable. There is a 3 mm high attenuating focus along the left posterior bladder wall adjacent to the left UVJ which may be artifactual or represent a recently passed stone or left UVJ calculus. There is mild left hydronephrosis. Mild left perinephric stranding. Correlation with urinalysis recommended to exclude superimposed UTI. The right kidney, and the right ureter and urinary bladder appear unremarkable. Stomach/Bowel: There is sigmoid diverticulosis without active inflammatory changes. There is moderate stool throughout the colon. There is a small hiatal hernia. There is no bowel obstruction or active inflammation. The appendix is normal. Vascular/Lymphatic: Advanced aortoiliac atherosclerotic disease. The IVC is unremarkable. No portal venous gas. There is no adenopathy. Reproductive: Prostate brachytherapy seeds. Other: None Musculoskeletal: Degenerative changes of the spine. No acute osseous pathology. IMPRESSION: 1. A 3 mm recently passed stone or left UVJ calculus with mild left hydronephrosis. Correlation with urinalysis recommended to exclude superimposed UTI. 2. Sigmoid diverticulosis. No bowel obstruction. Normal appendix. 3. Aortic Atherosclerosis (ICD10-I70.0). Electronically Signed   By: Anner Crete M.D.   On: 01/18/2021 23:55   US RENAL  Result Date: 01/20/2021 CLINICAL DATA:   Hydronephrosis EXAM: RENAL / URINARY TRACT ULTRASOUND COMPLETE COMPARISON:  None. FINDINGS: Right Kidney: Renal measurements: 8.2 x 4.5 x 4.7 cm = volume: 88.5 mL. Echogenicity within normal limits. No mass or hydronephrosis visualized. Left Kidney: Renal measurements: 9.8 x 5.7 x 5.2 cm = volume: 150.8 mL. Echogenicity within normal limits. No mass or hydronephrosis visualized. Bladder: Contains a Foley catheter without complete decompression. Other: None. IMPRESSION: 1. There is a Foley catheter in the bladder without complete decompression. 2. No acute abnormalities identified in the kidneys. No hydronephrosis. Electronically Signed   By: Dorise Bullion III M.D   On: 01/20/2021 12:55      Discharge Exam: Vitals:   01/20/21 0453 01/20/21 1335  BP: 132/65 (!) 157/69  Pulse: (!) 51 62  Resp: 16 18  Temp: 98.1 F (36.7 C) 97.8 F (36.6 C)  SpO2: 96% 97%     General: Pt is alert, awake, not in acute distress Cardiovascular: RRR, S1/S2 +, no edema Respiratory: CTA bilaterally, no wheezing, no rhonchi, no respiratory distress, no conversational dyspnea  Abdominal: Soft, NT, ND, bowel sounds + Extremities: no edema, no cyanosis Psych: Normal mood and affect, stable judgement and insight     The results of significant diagnostics from this hospitalization (including imaging, microbiology, ancillary and laboratory) are listed below for reference.     Microbiology: Recent Results (from the past 240 hour(s))  Urine Culture     Status: None   Collection Time: 01/19/21  1:00 AM   Specimen: Urine, Catheterized  Result Value Ref Range Status   Specimen Description   Final    URINE, CATHETERIZED Performed at [place] [address], [place], [place] [address]    Special Requests   Final    NONE Performed at [facility], [place] [address], [place], [place] [address]    Culture   Final    NO GROWTH Performed at [facility], [place]  [address] [place], [place] [address]    Report Status 01/20/2021 FINAL  Final  Resp Panel by RT-PCR (Flu A&B, Covid) Urine, Clean Catch     Status: None   Collection Time: 01/19/21  1:05 AM   Specimen: Urine, Clean Catch; Nasopharyngeal(NP) swabs in vial transport medium  Result Value Ref Range Status   SARS Coronavirus  2 by RT PCR NEGATIVE NEGATIVE Final    Comment: (NOTE) SARS-CoV-2 target nucleic acids are NOT DETECTED.  The SARS-CoV-2 RNA is generally detectable in upper respiratory specimens during the acute phase of infection. The lowest concentration of SARS-CoV-2 viral copies this assay can detect is 138 copies/mL. A negative result does not preclude SARS-Cov-2 infection and should not be used as the sole basis for treatment or other patient management decisions. A negative result may occur with  improper specimen collection/handling, submission of specimen other than nasopharyngeal swab, presence of viral mutation(s) within the areas targeted by this assay, and inadequate number of viral copies(<138 copies/mL). A negative result must be combined with clinical observations, patient history, and epidemiological information. The expected result is Negative.  Fact Sheet for Patients:  EntrepreneurPulse.com.au  Fact Sheet for Healthcare Providers:  IncredibleEmployment.be  This test is no t yet approved or cleared by the [place] FDA and  has been authorized for detection and/or diagnosis of SARS-CoV-2 by FDA under an Emergency Use Authorization (EUA). This EUA will remain  in effect (meaning this test can be used) for the duration of the COVID-19 declaration under Section 564(b)(1) of the Act, 21 U.S.C.section 360bbb-3(b)(1), unless the authorization is terminated  or revoked sooner.       Influenza A by PCR NEGATIVE NEGATIVE Final   Influenza B by PCR NEGATIVE NEGATIVE Final    Comment: (NOTE) The Xpert Xpress SARS-CoV-2/FLU/RSV plus  assay is intended as an aid in the diagnosis of influenza from Nasopharyngeal swab specimens and should not be used as a sole basis for treatment. Nasal washings and aspirates are unacceptable for Xpert Xpress SARS-CoV-2/FLU/RSV testing.  Fact Sheet for Patients: EntrepreneurPulse.com.au  Fact Sheet for Healthcare Providers: IncredibleEmployment.be  This test is not yet approved or cleared by the [place] FDA and has been authorized for detection and/or diagnosis of SARS-CoV-2 by FDA under an Emergency Use Authorization (EUA). This EUA will remain in effect (meaning this test can be used) for the duration of the COVID-19 declaration under Section 564(b)(1) of the Act, 21 U.S.C. section 360bbb-3(b)(1), unless the authorization is terminated or revoked.  Performed at [facility], [place] [address] [place], [place] [address]   MRSA Next Gen by PCR, Nasal     Status: None   Collection Time: 01/19/21  5:26 PM   Specimen: Nasal Mucosa; Nasal Swab  Result Value Ref Range Status   MRSA by PCR Next Gen NOT DETECTED NOT DETECTED Final    Comment: (NOTE) The GeneXpert MRSA Assay (FDA approved for NASAL specimens only), is one component of a comprehensive MRSA colonization surveillance program. It is not intended to diagnose MRSA infection nor to guide or monitor treatment for MRSA infections. Test performance is not FDA approved in patients less than 72 years old. Performed at [facility], [place] [address] [place], [place] [address]      Labs: BNP (last 3 results) No results for input(s): BNP in the last 8760 hours. Basic Metabolic Panel: Recent Labs  Lab 01/19/21 0030 01/19/21 1143 01/20/21 0513  NA 135 140 140  K 3.6 3.0* 4.3  CL 101 113* 106  CO2 21* 22 26  GLUCOSE 168* 100* 105*  BUN 24* 17 18  CREATININE 1.79* 0.91 0.98  CALCIUM 10.2 7.1* 9.1  MG  --  1.6* 2.1   Liver Function  Tests: Recent Labs  Lab 01/19/21 0030 01/19/21 1143  AST 29 21  ALT 21 14  ALKPHOS 62  40  BILITOT 1.0 0.7  PROT 7.4 5.0*  ALBUMIN 4.3 2.8*   No results for input(s): LIPASE, AMYLASE in the last 168 hours. No results for input(s): AMMONIA in the last 168 hours. CBC: Recent Labs  Lab 01/19/21 0030 01/19/21 1143 01/20/21 0513  WBC 27.6* 15.8* 14.6*  NEUTROABS 24.4* 12.3* 8.7*  HGB 15.2 11.4* 13.7  HCT 43.7 34.6* 41.9  MCV 89.2 92.8 92.1  PLT 373 247 274   Cardiac Enzymes: No results for input(s): CKTOTAL, CKMB, CKMBINDEX, TROPONINI in the last 168 hours. BNP: Invalid input(s): POCBNP CBG: No results for input(s): GLUCAP in the last 168 hours. D-Dimer No results for input(s): DDIMER in the last 72 hours. Hgb A1c No results for input(s): HGBA1C in the last 72 hours. Lipid Profile No results for input(s): CHOL, HDL, LDLCALC, TRIG, CHOLHDL, LDLDIRECT in the last 72 hours. Thyroid function studies No results for input(s): TSH, T4TOTAL, T3FREE, THYROIDAB in the last 72 hours.  Invalid input(s): FREET3 Anemia work up No results for input(s): VITAMINB12, FOLATE, FERRITIN, TIBC, IRON, RETICCTPCT in the last 72 hours. Urinalysis    Component Value Date/Time   COLORURINE YELLOW 01/19/2021 0100   APPEARANCEUR HAZY (A) 01/19/2021 0100   LABSPEC 1.011 01/19/2021 0100   PHURINE 7.0 01/19/2021 0100   GLUCOSEU NEGATIVE 01/19/2021 0100   HGBUR LARGE (A) 01/19/2021 0100   BILIRUBINUR NEGATIVE 01/19/2021 0100   KETONESUR NEGATIVE 01/19/2021 0100   PROTEINUR 100 (A) 01/19/2021 0100   UROBILINOGEN 0.2 10/08/2011 1407   NITRITE NEGATIVE 01/19/2021 0100   LEUKOCYTESUR MODERATE (A) 01/19/2021 0100   Sepsis Labs Invalid input(s): PROCALCITONIN,  WBC,  LACTICIDVEN Microbiology Recent Results (from the past 240 hour(s))  Urine Culture     Status: None   Collection Time: 01/19/21  1:00 AM   Specimen: Urine, Catheterized  Result Value Ref Range Status   Specimen Description    Final    URINE, CATHETERIZED Performed at [facility], [place] [address] [place], [place] [address]    Special Requests   Final    NONE Performed at [facility], [place] [address] [place], [place] [address]    Culture   Final    NO GROWTH Performed at [facility], [place] [address] [place],  [address]    Report Status 01/20/2021 FINAL  Final  Resp Panel by RT-PCR (Flu A&B, Covid) Urine, Clean Catch     Status: None   Collection Time: 01/19/21  1:05 AM   Specimen: Urine, Clean Catch; Nasopharyngeal(NP) swabs in vial transport medium  Result Value Ref Range Status   SARS Coronavirus 2 by RT PCR NEGATIVE NEGATIVE Final    Comment: (NOTE) SARS-CoV-2 target nucleic acids are NOT DETECTED.  The SARS-CoV-2 RNA is generally detectable in upper respiratory specimens during the acute phase of infection. The lowest concentration of SARS-CoV-2 viral copies this assay can detect is 138 copies/mL. A negative result does not preclude SARS-Cov-2 infection and should not be used as the sole basis for treatment or other patient management decisions. A negative result may occur with  improper specimen collection/handling, submission of specimen other than nasopharyngeal swab, presence of viral mutation(s) within the areas targeted by this assay, and inadequate number of viral copies(<138 copies/mL). A negative result must be combined with clinical observations, patient history, and epidemiological information. The expected result is Negative.  Fact Sheet for Patients:  EntrepreneurPulse.com.au  Fact Sheet for Healthcare Providers:  IncredibleEmployment.be  This test is no t yet approved  or cleared by the [place] and  has been authorized for detection and/or diagnosis of SARS-CoV-2 by FDA under an Emergency Use Authorization (EUA). This EUA will remain  in effect (meaning this test can be used) for the  duration of the COVID-19 declaration under Section 564(b)(1) of the Act, 21 U.S.C.section 360bbb-3(b)(1), unless the authorization is terminated  or revoked sooner.       Influenza A by PCR NEGATIVE NEGATIVE Final   Influenza B by PCR NEGATIVE NEGATIVE Final    Comment: (NOTE) The Xpert Xpress SARS-CoV-2/FLU/RSV plus assay is intended as an aid in the diagnosis of influenza from Nasopharyngeal swab specimens and should not be used as a sole basis for treatment. Nasal washings and aspirates are unacceptable for Xpert Xpress SARS-CoV-2/FLU/RSV testing.  Fact Sheet for Patients: EntrepreneurPulse.com.au  Fact Sheet for Healthcare Providers: IncredibleEmployment.be  This test is not yet approved or cleared by the [place] FDA and has been authorized for detection and/or diagnosis of SARS-CoV-2 by FDA under an Emergency Use Authorization (EUA). This EUA will remain in effect (meaning this test can be used) for the duration of the COVID-19 declaration under Section 564(b)(1) of the Act, 21 U.S.C. section 360bbb-3(b)(1), unless the authorization is terminated or revoked.  Performed at [facility], [place] [address] [place], [place] [address]   MRSA Next Gen by PCR, Nasal     Status: None   Collection Time: 01/19/21  5:26 PM   Specimen: Nasal Mucosa; Nasal Swab  Result Value Ref Range Status   MRSA by PCR Next Gen NOT DETECTED NOT DETECTED Final    Comment: (NOTE) The GeneXpert MRSA Assay (FDA approved for NASAL specimens only), is one component of a comprehensive MRSA colonization surveillance program. It is not intended to diagnose MRSA infection nor to guide or monitor treatment for MRSA infections. Test performance is not FDA approved in patients less than 75 years old. Performed at [facility], [place] [address], [place], [place] [address]      Patient was seen and examined on the day of  discharge and was found to be in stable condition. Time coordinating discharge: 35 minutes including assessment and coordination of care, as well as examination of the patient.   SIGNED:  Shelda Pal, DO [facility] 01/20/2021, 4:29 PM

## 2021-01-24 LAB — CULTURE, BLOOD (ROUTINE X 2)
Culture: NO GROWTH
Special Requests: ADEQUATE

## 2021-01-25 DIAGNOSIS — Z9289 Personal history of other medical treatment: Secondary | ICD-10-CM | POA: Diagnosis not present

## 2021-01-25 DIAGNOSIS — N179 Acute kidney failure, unspecified: Secondary | ICD-10-CM | POA: Diagnosis not present

## 2021-01-25 DIAGNOSIS — D72829 Elevated white blood cell count, unspecified: Secondary | ICD-10-CM | POA: Diagnosis not present

## 2021-01-25 DIAGNOSIS — R339 Retention of urine, unspecified: Secondary | ICD-10-CM | POA: Diagnosis not present

## 2021-01-30 DIAGNOSIS — N132 Hydronephrosis with renal and ureteral calculous obstruction: Secondary | ICD-10-CM | POA: Diagnosis not present

## 2021-01-30 DIAGNOSIS — R338 Other retention of urine: Secondary | ICD-10-CM | POA: Diagnosis not present

## 2021-01-30 DIAGNOSIS — N201 Calculus of ureter: Secondary | ICD-10-CM | POA: Diagnosis not present

## 2021-02-13 DIAGNOSIS — N320 Bladder-neck obstruction: Secondary | ICD-10-CM | POA: Diagnosis not present

## 2021-02-13 DIAGNOSIS — N35012 Post-traumatic membranous urethral stricture: Secondary | ICD-10-CM | POA: Diagnosis not present

## 2021-02-13 DIAGNOSIS — Z8546 Personal history of malignant neoplasm of prostate: Secondary | ICD-10-CM | POA: Diagnosis not present

## 2021-03-08 DIAGNOSIS — Z8546 Personal history of malignant neoplasm of prostate: Secondary | ICD-10-CM | POA: Diagnosis not present

## 2021-03-15 DIAGNOSIS — N320 Bladder-neck obstruction: Secondary | ICD-10-CM | POA: Diagnosis not present

## 2021-03-15 DIAGNOSIS — Z8546 Personal history of malignant neoplasm of prostate: Secondary | ICD-10-CM | POA: Diagnosis not present

## 2021-03-19 DIAGNOSIS — Z8546 Personal history of malignant neoplasm of prostate: Secondary | ICD-10-CM | POA: Diagnosis not present

## 2021-03-19 DIAGNOSIS — N35012 Post-traumatic membranous urethral stricture: Secondary | ICD-10-CM | POA: Diagnosis not present

## 2021-03-19 DIAGNOSIS — N320 Bladder-neck obstruction: Secondary | ICD-10-CM | POA: Diagnosis not present

## 2021-03-22 DIAGNOSIS — E782 Mixed hyperlipidemia: Secondary | ICD-10-CM | POA: Diagnosis not present

## 2021-03-22 DIAGNOSIS — I10 Essential (primary) hypertension: Secondary | ICD-10-CM | POA: Diagnosis not present

## 2021-03-22 DIAGNOSIS — Z9289 Personal history of other medical treatment: Secondary | ICD-10-CM | POA: Diagnosis not present

## 2021-03-22 DIAGNOSIS — Z8546 Personal history of malignant neoplasm of prostate: Secondary | ICD-10-CM | POA: Diagnosis not present

## 2021-03-22 DIAGNOSIS — Z0000 Encounter for general adult medical examination without abnormal findings: Secondary | ICD-10-CM | POA: Diagnosis not present

## 2021-03-22 DIAGNOSIS — Z85820 Personal history of malignant melanoma of skin: Secondary | ICD-10-CM | POA: Diagnosis not present

## 2021-03-22 DIAGNOSIS — F411 Generalized anxiety disorder: Secondary | ICD-10-CM | POA: Diagnosis not present

## 2021-03-22 DIAGNOSIS — Z23 Encounter for immunization: Secondary | ICD-10-CM | POA: Diagnosis not present

## 2021-04-19 DIAGNOSIS — N320 Bladder-neck obstruction: Secondary | ICD-10-CM | POA: Diagnosis not present

## 2021-05-24 DIAGNOSIS — R338 Other retention of urine: Secondary | ICD-10-CM | POA: Diagnosis not present

## 2021-06-19 DIAGNOSIS — R338 Other retention of urine: Secondary | ICD-10-CM | POA: Diagnosis not present

## 2021-07-06 ENCOUNTER — Other Ambulatory Visit: Payer: Self-pay | Admitting: Family Medicine

## 2021-07-06 DIAGNOSIS — Z9289 Personal history of other medical treatment: Secondary | ICD-10-CM | POA: Diagnosis not present

## 2021-07-06 DIAGNOSIS — R1030 Lower abdominal pain, unspecified: Secondary | ICD-10-CM | POA: Diagnosis not present

## 2021-07-06 DIAGNOSIS — N390 Urinary tract infection, site not specified: Secondary | ICD-10-CM | POA: Diagnosis not present

## 2021-07-06 DIAGNOSIS — Z8546 Personal history of malignant neoplasm of prostate: Secondary | ICD-10-CM | POA: Diagnosis not present

## 2021-07-06 DIAGNOSIS — R3915 Urgency of urination: Secondary | ICD-10-CM | POA: Diagnosis not present

## 2021-07-25 DIAGNOSIS — R300 Dysuria: Secondary | ICD-10-CM | POA: Diagnosis not present

## 2021-07-30 DIAGNOSIS — N320 Bladder-neck obstruction: Secondary | ICD-10-CM | POA: Diagnosis not present

## 2021-07-30 DIAGNOSIS — R338 Other retention of urine: Secondary | ICD-10-CM | POA: Diagnosis not present

## 2021-07-30 DIAGNOSIS — N201 Calculus of ureter: Secondary | ICD-10-CM | POA: Diagnosis not present

## 2021-07-30 DIAGNOSIS — R3914 Feeling of incomplete bladder emptying: Secondary | ICD-10-CM | POA: Diagnosis not present

## 2021-08-23 DIAGNOSIS — R338 Other retention of urine: Secondary | ICD-10-CM | POA: Diagnosis not present

## 2021-09-18 DIAGNOSIS — R338 Other retention of urine: Secondary | ICD-10-CM | POA: Diagnosis not present

## 2021-09-25 DIAGNOSIS — Z9289 Personal history of other medical treatment: Secondary | ICD-10-CM | POA: Diagnosis not present

## 2021-09-25 DIAGNOSIS — F411 Generalized anxiety disorder: Secondary | ICD-10-CM | POA: Diagnosis not present

## 2021-09-25 DIAGNOSIS — I10 Essential (primary) hypertension: Secondary | ICD-10-CM | POA: Diagnosis not present

## 2021-09-25 DIAGNOSIS — E782 Mixed hyperlipidemia: Secondary | ICD-10-CM | POA: Diagnosis not present

## 2021-10-15 DIAGNOSIS — I10 Essential (primary) hypertension: Secondary | ICD-10-CM | POA: Diagnosis not present

## 2021-10-15 DIAGNOSIS — K5900 Constipation, unspecified: Secondary | ICD-10-CM | POA: Diagnosis not present

## 2021-10-15 DIAGNOSIS — F411 Generalized anxiety disorder: Secondary | ICD-10-CM | POA: Diagnosis not present

## 2021-10-16 DIAGNOSIS — R338 Other retention of urine: Secondary | ICD-10-CM | POA: Diagnosis not present

## 2021-11-20 DIAGNOSIS — R338 Other retention of urine: Secondary | ICD-10-CM | POA: Diagnosis not present

## 2021-12-18 DIAGNOSIS — R338 Other retention of urine: Secondary | ICD-10-CM | POA: Diagnosis not present

## 2022-01-15 DIAGNOSIS — N320 Bladder-neck obstruction: Secondary | ICD-10-CM | POA: Diagnosis not present

## 2022-02-05 DIAGNOSIS — R338 Other retention of urine: Secondary | ICD-10-CM | POA: Diagnosis not present

## 2022-02-18 DIAGNOSIS — R338 Other retention of urine: Secondary | ICD-10-CM | POA: Diagnosis not present

## 2022-02-18 DIAGNOSIS — T83091S Other mechanical complication of indwelling urethral catheter, sequela: Secondary | ICD-10-CM | POA: Diagnosis not present

## 2022-02-25 ENCOUNTER — Other Ambulatory Visit: Payer: Self-pay | Admitting: Family Medicine

## 2022-02-25 ENCOUNTER — Ambulatory Visit
Admission: RE | Admit: 2022-02-25 | Discharge: 2022-02-25 | Disposition: A | Discharge status: Home / Self Care | Payer: PPO | Source: Ambulatory Visit | Attending: Family Medicine | Admitting: Family Medicine

## 2022-02-25 DIAGNOSIS — M533 Sacrococcygeal disorders, not elsewhere classified: Secondary | ICD-10-CM | POA: Diagnosis not present

## 2022-02-25 DIAGNOSIS — M25552 Pain in left hip: Secondary | ICD-10-CM

## 2022-03-14 DIAGNOSIS — R338 Other retention of urine: Secondary | ICD-10-CM | POA: Diagnosis not present

## 2022-03-14 DIAGNOSIS — N3000 Acute cystitis without hematuria: Secondary | ICD-10-CM | POA: Diagnosis not present

## 2022-03-28 DIAGNOSIS — Z0000 Encounter for general adult medical examination without abnormal findings: Secondary | ICD-10-CM | POA: Diagnosis not present

## 2022-03-28 DIAGNOSIS — Z9289 Personal history of other medical treatment: Secondary | ICD-10-CM | POA: Diagnosis not present

## 2022-03-28 DIAGNOSIS — Z8546 Personal history of malignant neoplasm of prostate: Secondary | ICD-10-CM | POA: Diagnosis not present

## 2022-03-28 DIAGNOSIS — I10 Essential (primary) hypertension: Secondary | ICD-10-CM | POA: Diagnosis not present

## 2022-03-28 DIAGNOSIS — Z23 Encounter for immunization: Secondary | ICD-10-CM | POA: Diagnosis not present

## 2022-03-28 DIAGNOSIS — F411 Generalized anxiety disorder: Secondary | ICD-10-CM | POA: Diagnosis not present

## 2022-03-28 DIAGNOSIS — Z85820 Personal history of malignant melanoma of skin: Secondary | ICD-10-CM | POA: Diagnosis not present

## 2022-03-28 DIAGNOSIS — E782 Mixed hyperlipidemia: Secondary | ICD-10-CM | POA: Diagnosis not present

## 2022-04-11 DIAGNOSIS — R338 Other retention of urine: Secondary | ICD-10-CM | POA: Diagnosis not present

## 2022-05-13 DIAGNOSIS — R338 Other retention of urine: Secondary | ICD-10-CM | POA: Diagnosis not present

## 2022-06-11 DIAGNOSIS — R338 Other retention of urine: Secondary | ICD-10-CM | POA: Diagnosis not present

## 2022-07-10 DIAGNOSIS — R338 Other retention of urine: Secondary | ICD-10-CM | POA: Diagnosis not present

## 2022-08-13 ENCOUNTER — Ambulatory Visit
Admission: RE | Admit: 2022-08-13 | Discharge: 2022-08-13 | Disposition: A | Discharge status: Home / Self Care | Payer: PPO | Source: Ambulatory Visit | Attending: Family Medicine | Admitting: Family Medicine

## 2022-08-13 ENCOUNTER — Other Ambulatory Visit: Payer: Self-pay | Admitting: Family Medicine

## 2022-08-13 DIAGNOSIS — R0600 Dyspnea, unspecified: Secondary | ICD-10-CM

## 2022-08-13 DIAGNOSIS — J984 Other disorders of lung: Secondary | ICD-10-CM | POA: Diagnosis not present

## 2022-08-13 DIAGNOSIS — R413 Other amnesia: Secondary | ICD-10-CM | POA: Diagnosis not present

## 2022-08-13 DIAGNOSIS — R918 Other nonspecific abnormal finding of lung field: Secondary | ICD-10-CM | POA: Diagnosis not present

## 2022-08-14 DIAGNOSIS — R338 Other retention of urine: Secondary | ICD-10-CM | POA: Diagnosis not present

## 2022-08-15 DIAGNOSIS — R509 Fever, unspecified: Secondary | ICD-10-CM | POA: Diagnosis not present

## 2022-08-15 DIAGNOSIS — R112 Nausea with vomiting, unspecified: Secondary | ICD-10-CM | POA: Diagnosis not present

## 2022-08-15 DIAGNOSIS — Z8546 Personal history of malignant neoplasm of prostate: Secondary | ICD-10-CM | POA: Diagnosis not present

## 2022-08-15 DIAGNOSIS — Z978 Presence of other specified devices: Secondary | ICD-10-CM | POA: Diagnosis not present

## 2022-08-15 DIAGNOSIS — N3001 Acute cystitis with hematuria: Secondary | ICD-10-CM | POA: Diagnosis not present

## 2022-08-15 DIAGNOSIS — I10 Essential (primary) hypertension: Secondary | ICD-10-CM | POA: Diagnosis not present

## 2022-08-19 ENCOUNTER — Encounter (HOSPITAL_COMMUNITY): Payer: Self-pay

## 2022-08-19 ENCOUNTER — Ambulatory Visit (HOSPITAL_COMMUNITY)
Admission: EM | Admit: 2022-08-19 | Discharge: 2022-08-19 | Disposition: A | Discharge status: Home / Self Care | Payer: PPO | Attending: Internal Medicine | Admitting: Internal Medicine

## 2022-08-19 DIAGNOSIS — R0789 Other chest pain: Secondary | ICD-10-CM

## 2022-08-19 NOTE — ED Triage Notes (Addendum)
Patient c/o chest x pain 2 hours. Patient denies any N/V, SOB, radiation of pain or diaphoresis. Patient then states no chest pain in the past 30 minutes.  Patient states pain is worse in cold air and states that the pain gets better when he changes the environment.

## 2022-08-19 NOTE — ED Provider Notes (Signed)
[place]    CSN: [id number] Arrival date & time: 08/19/22  1306      History   Chief Complaint Chief Complaint  Patient presents with   Chest Pain   Nasal Congestion    HPI [place] is a 89 y.o. male comes to the urgent care with right-sided chest pain which started 2 hours ago.  Patient said the pain was sharp in nature with sudden onset.  It worsened with a deep breath and exposure to cold air.  Pain stopped after he went to warm environment.  Pain does not radiate.  No cold sweats or dizziness.  No nausea or vomiting.  No cough or sputum production.  No fever or chills.  Patient denies any syncope or near syncopal episode.  No cough or sputum production.  No wheezing or chest tightness.  No trauma to the chest. HPI  Past Medical History:  Diagnosis Date   Anxiety    Arthritis    Benign prostate hyperplasia    Cancer ([place]) 2019   history of prostate cancer   Esophageal reflux    H/O melanoma excision    Excised in 1988   H/O prostate cancer    Seed implantation   Hypertension     Patient Active Problem List   Diagnosis Date Noted   Acute cystitis 01/20/2021   Urinary retention 01/19/2021   AKI (acute kidney injury) ([place]) 01/19/2021   GERD without esophagitis 99991111   Complicated UTI (urinary tract infection) 01/19/2021   Lactic acidosis 01/19/2021   Anxiety disorder 01/19/2021   Pulmonary nodules 10/23/2011   C. difficile colitis 10/10/2011   Hyponatremia 10/08/2011   Abnormal CT scan, chest 10/08/2011   Diarrhea 10/08/2011   Leukocytosis 10/06/2011   Community acquired pneumonia 10/06/2011   H/O prostate cancer 10/06/2011   H/O melanoma excision 10/06/2011   Encephalopathy 10/06/2011   METHICILLIN RESISTANT STAPHYLOCOCCUS AUREUS INFECTION 03/24/2008    Past Surgical History:  Procedure Laterality Date   CYSTOSCOPY N/A 05/01/2020   Procedure: CYSTOSCOPY catheter placement;  Surgeon: Ardis Hughs, MD;  Location: [facility];   Service: Urology;  Laterality: N/A;   INGUINAL HERNIA REPAIR     Bilaterally   PROSTATE SURGERY  2009   removal of melanoma   1988   removed from back   [place] N/A 06/07/2020   Procedure: LASER TRANSURETHRAL RESECTION OF THE PROSTATE (TURP) HOLMIUM;  Surgeon: Ardis Hughs, MD;  Location: [facility];  Service: Urology;  Laterality: N/A;       Home Medications    Prior to Admission medications   Medication Sig Start Date End Date Taking? Authorizing Provider  aspirin 325 MG tablet Take 325 mg by mouth daily.    [provider]  calcium-vitamin D (OSCAL WITH D) 500-200 MG-UNIT per tablet Take 2 tablets by mouth daily with breakfast.     [provider]  clonazePAM (KLONOPIN) 0.5 MG tablet Take 0.5 mg by mouth at bedtime.     [provider]  fish oil-omega-3 fatty acids 1000 MG capsule Take 2,000 mg by mouth 2 (two) times daily.    [provider]  lisinopril (ZESTRIL) 20 MG tablet Take 20 mg by mouth daily. 09/12/20   [provider]  pantoprazole (PROTONIX) 40 MG tablet Take 40 mg by mouth daily.    [provider]    Family History Family History  Problem Relation Age of Onset   Coronary artery disease Other  Stroke Other     Social History Social History   Tobacco Use   Smoking status: Former    Types: Cigarettes    Quit date: 10/22/1971    Years since quitting: 50.8   Smokeless tobacco: Never  Vaping Use   Vaping Use: Never used  Substance Use Topics   Alcohol use: No   Drug use: No     Allergies   Bee venom, Codeine, Elemental sulfur, Statins, Sulfa antibiotics, and Welchol [colesevelam hcl]   Review of Systems Review of Systems As per HPI  Physical Exam Triage Vital Signs ED Triage Vitals  Enc Vitals Group     BP 08/19/22 1321 136/61     Pulse Rate 08/19/22 1321 63     Resp 08/19/22 1321 16     Temp 08/19/22 1321 (!) 97.5 F (36.4 C)     Temp Source 08/19/22 1321  Oral     SpO2 08/19/22 1321 96 %     Weight --      Height --      Head Circumference --      Peak Flow --      Pain Score 08/19/22 1322 (P) 0     Pain Loc --      Pain Edu? --      Excl. in [place]? --    No data found.  Updated Vital Signs BP 136/61 (BP Location: Right Arm)   Pulse 63   Temp (!) 97.5 F (36.4 C) (Oral)   Resp 16   SpO2 96%   Visual Acuity Right Eye Distance:   Left Eye Distance:   Bilateral Distance:    Right Eye Near:   Left Eye Near:    Bilateral Near:     Physical Exam Vitals and nursing note reviewed.  Constitutional:      General: He is not in acute distress.    Appearance: He is not ill-appearing.  Cardiovascular:     Rate and Rhythm: Normal rate and regular rhythm.     Heart sounds: Normal heart sounds.  Pulmonary:     Effort: Pulmonary effort is normal.     Breath sounds: Normal breath sounds.  Abdominal:     General: Bowel sounds are normal.     Palpations: Abdomen is soft.  Neurological:     Mental Status: He is alert.      UC Treatments / Results  Labs (all labs ordered are listed, but only abnormal results are displayed) Labs Reviewed - No data to display  EKG   Radiology No results found.  Procedures Procedures (including critical care time)  Medications Ordered in UC Medications - No data to display  Initial Impression / Assessment and Plan / UC Course  I have reviewed the triage vital signs and the nursing notes.  Pertinent labs & imaging results that were available during my care of the patient were reviewed by me and considered in my medical decision making (see chart for details).     1.  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  EKG compared to the previous EKGs shows T wave inversion in leads V2 and V3.  No ST changes. Patient is advised to follow-up with a primary care provider for further evaluation.  Chest pain is atypical and the EKG  is unimpressive hence this is less likely secondary to a cardiac event. Return precautions given. Final Clinical Impressions(s) / UC Diagnoses   Final diagnoses:  Chest wall pain     Discharge Instructions      Please take Tylenol as needed for pain and/or fever Your EKG is unimpressive If your chest pain recurs please go to the emergency department for further evaluation.    ED Prescriptions   None    PDMP not reviewed this encounter.   Chase Picket, MD 08/19/22 [phone]

## 2022-08-19 NOTE — Discharge Instructions (Addendum)
Please take Tylenol as needed for pain and/or fever Your EKG is unimpressive If your chest pain recurs please go to the emergency department for further evaluation.

## 2022-08-22 DIAGNOSIS — Z978 Presence of other specified devices: Secondary | ICD-10-CM | POA: Diagnosis not present

## 2022-08-22 DIAGNOSIS — D72829 Elevated white blood cell count, unspecified: Secondary | ICD-10-CM | POA: Diagnosis not present

## 2022-08-22 DIAGNOSIS — R399 Unspecified symptoms and signs involving the genitourinary system: Secondary | ICD-10-CM | POA: Diagnosis not present

## 2022-08-22 DIAGNOSIS — R413 Other amnesia: Secondary | ICD-10-CM | POA: Diagnosis not present

## 2022-08-22 DIAGNOSIS — B965 Pseudomonas (aeruginosa) (mallei) (pseudomallei) as the cause of diseases classified elsewhere: Secondary | ICD-10-CM | POA: Diagnosis not present

## 2022-08-28 DIAGNOSIS — R338 Other retention of urine: Secondary | ICD-10-CM | POA: Diagnosis not present

## 2022-09-09 DIAGNOSIS — D72829 Elevated white blood cell count, unspecified: Secondary | ICD-10-CM | POA: Diagnosis not present

## 2022-09-19 DIAGNOSIS — R338 Other retention of urine: Secondary | ICD-10-CM | POA: Diagnosis not present

## 2022-09-24 DIAGNOSIS — T161XXA Foreign body in right ear, initial encounter: Secondary | ICD-10-CM | POA: Diagnosis not present

## 2022-09-26 DIAGNOSIS — Z9289 Personal history of other medical treatment: Secondary | ICD-10-CM | POA: Diagnosis not present

## 2022-09-26 DIAGNOSIS — R399 Unspecified symptoms and signs involving the genitourinary system: Secondary | ICD-10-CM | POA: Diagnosis not present

## 2022-09-26 DIAGNOSIS — I10 Essential (primary) hypertension: Secondary | ICD-10-CM | POA: Diagnosis not present

## 2022-09-26 DIAGNOSIS — E782 Mixed hyperlipidemia: Secondary | ICD-10-CM | POA: Diagnosis not present

## 2022-09-26 DIAGNOSIS — G3184 Mild cognitive impairment, so stated: Secondary | ICD-10-CM | POA: Diagnosis not present

## 2022-10-09 DIAGNOSIS — R338 Other retention of urine: Secondary | ICD-10-CM | POA: Diagnosis not present

## 2022-10-09 DIAGNOSIS — S30861A Insect bite (nonvenomous) of abdominal wall, initial encounter: Secondary | ICD-10-CM | POA: Diagnosis not present

## 2022-10-09 DIAGNOSIS — W57XXXA Bitten or stung by nonvenomous insect and other nonvenomous arthropods, initial encounter: Secondary | ICD-10-CM | POA: Diagnosis not present

## 2022-10-23 DIAGNOSIS — W57XXXA Bitten or stung by nonvenomous insect and other nonvenomous arthropods, initial encounter: Secondary | ICD-10-CM | POA: Diagnosis not present

## 2022-10-23 DIAGNOSIS — S30861A Insect bite (nonvenomous) of abdominal wall, initial encounter: Secondary | ICD-10-CM | POA: Diagnosis not present

## 2022-10-23 DIAGNOSIS — R5383 Other fatigue: Secondary | ICD-10-CM | POA: Diagnosis not present

## 2022-11-01 DIAGNOSIS — R310 Gross hematuria: Secondary | ICD-10-CM | POA: Diagnosis not present

## 2022-11-01 DIAGNOSIS — R338 Other retention of urine: Secondary | ICD-10-CM | POA: Diagnosis not present

## 2022-11-04 ENCOUNTER — Encounter: Payer: Self-pay | Admitting: Family Medicine

## 2022-11-04 ENCOUNTER — Other Ambulatory Visit: Payer: Self-pay | Admitting: Family Medicine

## 2022-11-04 DIAGNOSIS — F411 Generalized anxiety disorder: Secondary | ICD-10-CM | POA: Diagnosis not present

## 2022-11-04 DIAGNOSIS — R519 Headache, unspecified: Secondary | ICD-10-CM

## 2022-11-22 DIAGNOSIS — R338 Other retention of urine: Secondary | ICD-10-CM | POA: Diagnosis not present

## 2022-12-09 ENCOUNTER — Other Ambulatory Visit: Payer: PPO

## 2022-12-13 DIAGNOSIS — R338 Other retention of urine: Secondary | ICD-10-CM | POA: Diagnosis not present

## 2022-12-18 ENCOUNTER — Ambulatory Visit
Admission: RE | Admit: 2022-12-18 | Discharge: 2022-12-18 | Disposition: A | Discharge status: Home / Self Care | Payer: PPO | Source: Ambulatory Visit | Attending: Family Medicine | Admitting: Family Medicine

## 2022-12-18 DIAGNOSIS — R29818 Other symptoms and signs involving the nervous system: Secondary | ICD-10-CM | POA: Diagnosis not present

## 2022-12-18 DIAGNOSIS — R519 Headache, unspecified: Secondary | ICD-10-CM

## 2022-12-18 DIAGNOSIS — G319 Degenerative disease of nervous system, unspecified: Secondary | ICD-10-CM | POA: Diagnosis not present

## 2023-01-02 DIAGNOSIS — R338 Other retention of urine: Secondary | ICD-10-CM | POA: Diagnosis not present

## 2023-01-16 IMAGING — US US RENAL
1 series · 14 of 25 positions shown · non-contrast
Comparison: None.

CLINICAL DATA: Hydronephrosis

EXAM:
RENAL / URINARY TRACT ULTRASOUND COMPLETE

[Series 1: us renal · 14 of 74 slices shown]
[im 1/74]
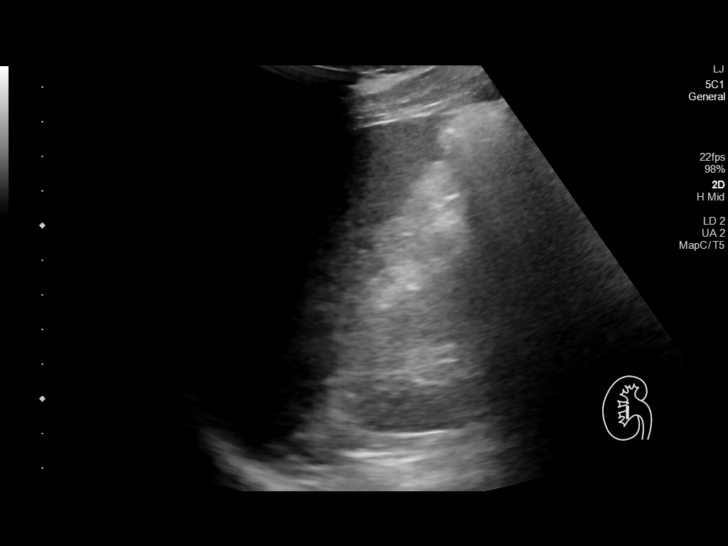
[im 7/74]
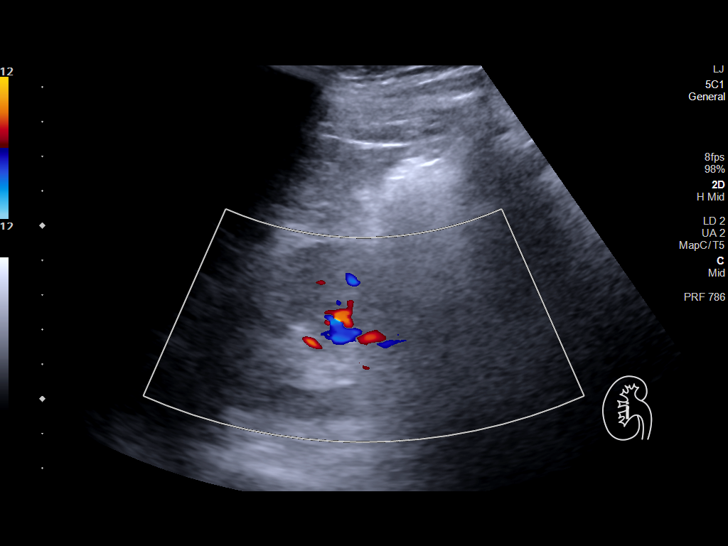
[im 13/74]
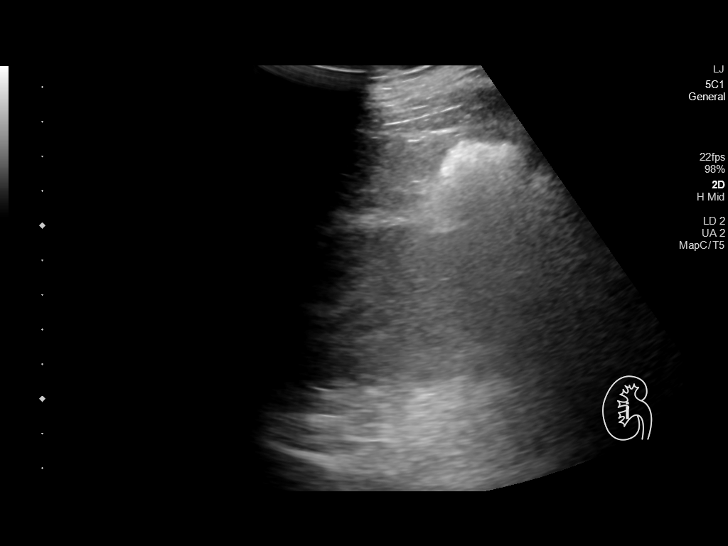
[im 19/74]
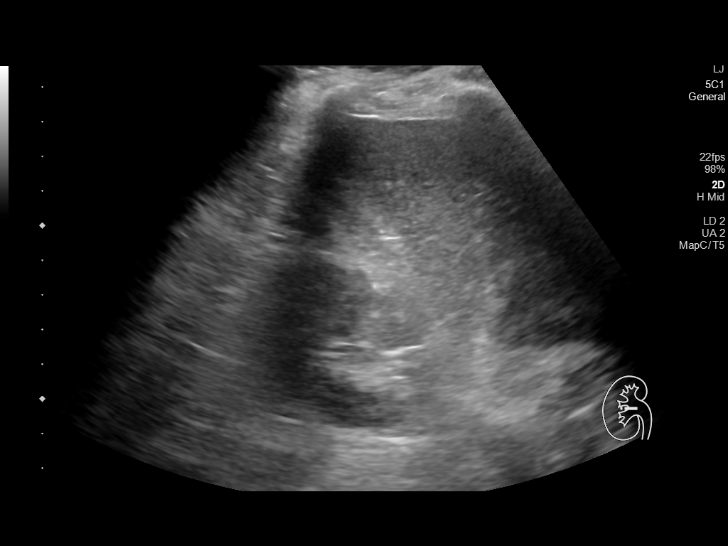
[im 25/74]
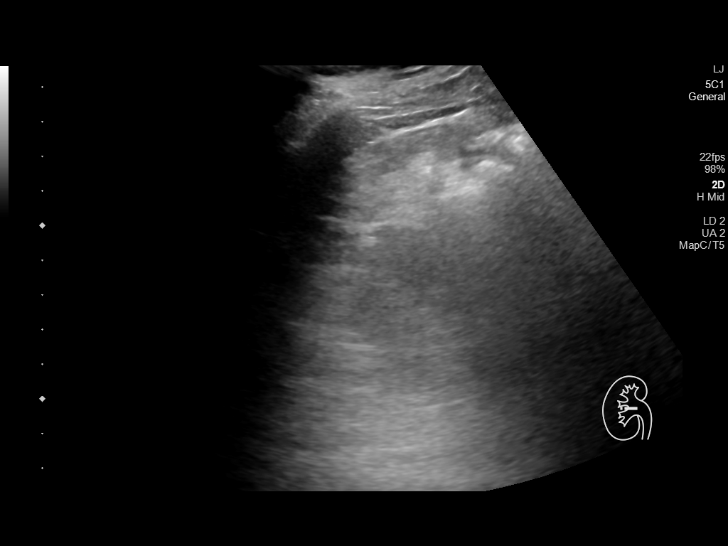
[im 28/74]
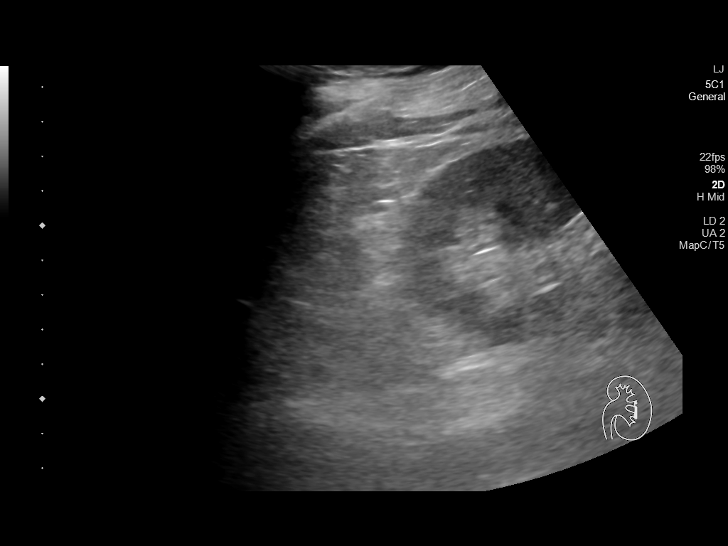
[im 34/74]
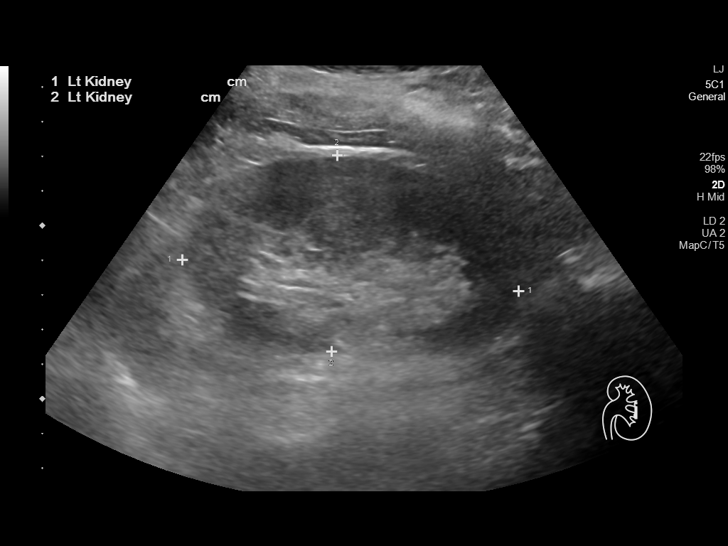
[im 40/74]
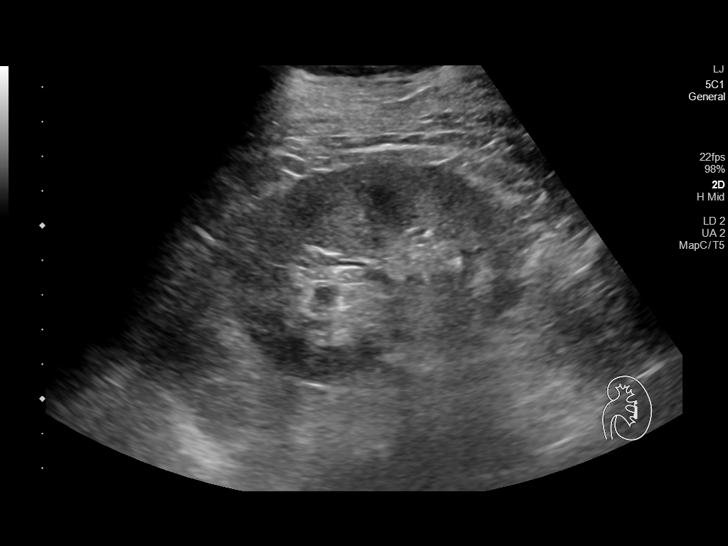
[im 46/74]
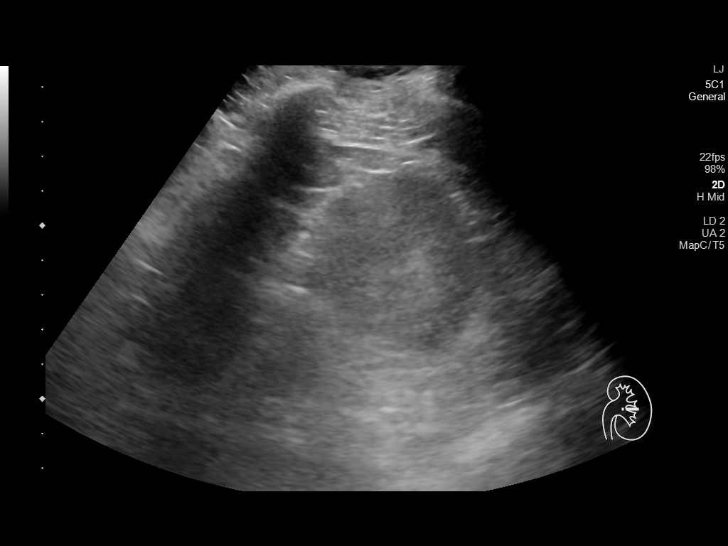
[im 49/74]
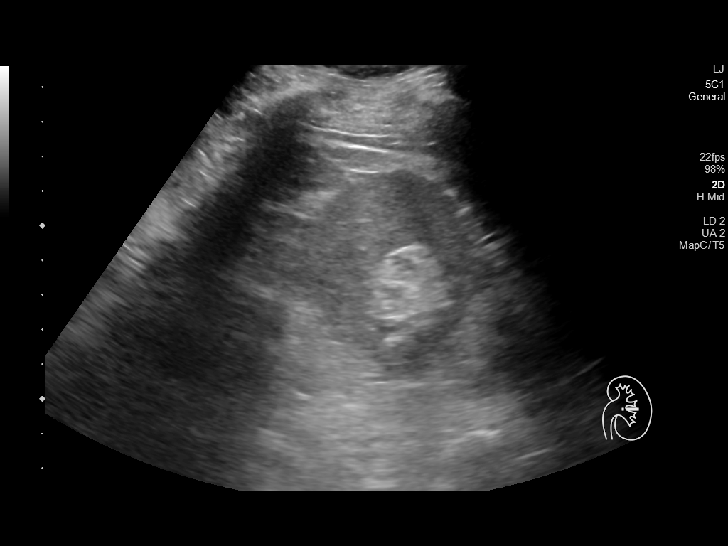
[im 55/74]
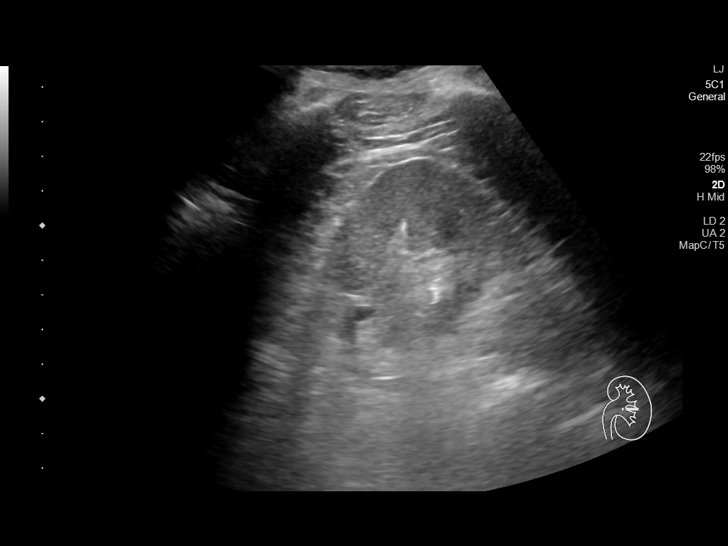
[im 61/74]
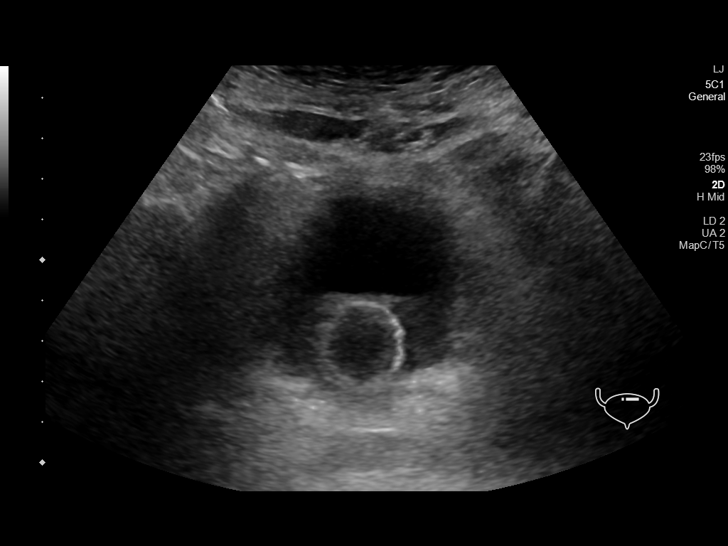
[im 67/74]
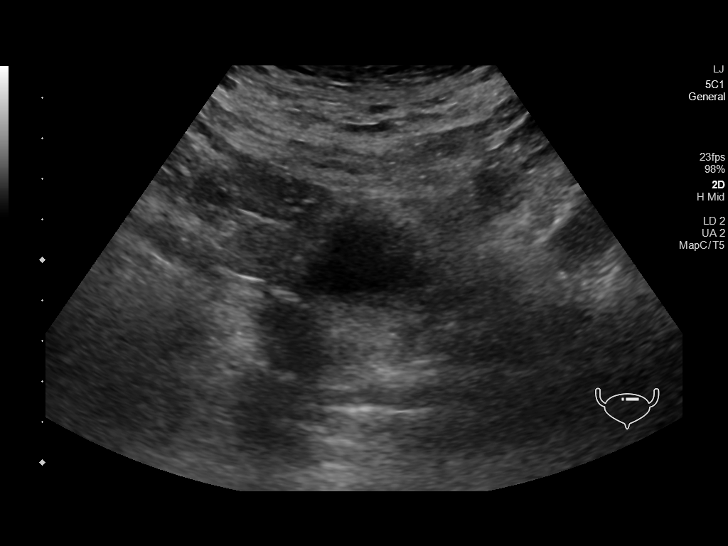
[im 74/74]
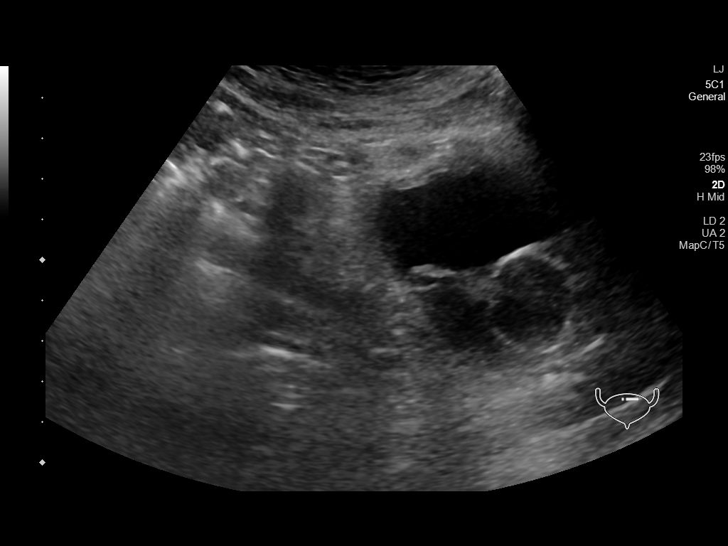

[14 of 25 positions shown; findings below may reference images not displayed]

FINDINGS: Right Kidney:

Renal measurements: 8.2 x 4.5 x 4.7 cm = volume: 88.5 mL.
Echogenicity within normal limits. No mass or hydronephrosis
visualized.

Left Kidney:

Renal measurements: 9.8 x 5.7 x 5.2 cm = volume: 150.8 mL.
Echogenicity within normal limits. No mass or hydronephrosis
visualized.

Bladder:

Contains a Foley catheter without complete decompression.

Other:

None.
IMPRESSION: 1. There is a Foley catheter in the bladder without complete
decompression.
2. No acute abnormalities identified in the kidneys. No
hydronephrosis.

## 2023-01-23 DIAGNOSIS — R338 Other retention of urine: Secondary | ICD-10-CM | POA: Diagnosis not present

## 2023-02-27 DIAGNOSIS — N320 Bladder-neck obstruction: Secondary | ICD-10-CM | POA: Diagnosis not present

## 2023-02-27 DIAGNOSIS — Z8546 Personal history of malignant neoplasm of prostate: Secondary | ICD-10-CM | POA: Diagnosis not present

## 2023-03-06 DIAGNOSIS — N3941 Urge incontinence: Secondary | ICD-10-CM | POA: Diagnosis not present

## 2023-03-06 DIAGNOSIS — R3914 Feeling of incomplete bladder emptying: Secondary | ICD-10-CM | POA: Diagnosis not present

## 2023-03-11 DIAGNOSIS — R3914 Feeling of incomplete bladder emptying: Secondary | ICD-10-CM | POA: Diagnosis not present

## 2023-03-11 DIAGNOSIS — N320 Bladder-neck obstruction: Secondary | ICD-10-CM | POA: Diagnosis not present

## 2023-03-11 DIAGNOSIS — N3941 Urge incontinence: Secondary | ICD-10-CM | POA: Diagnosis not present

## 2023-04-03 DIAGNOSIS — I10 Essential (primary) hypertension: Secondary | ICD-10-CM | POA: Diagnosis not present

## 2023-04-03 DIAGNOSIS — Z8546 Personal history of malignant neoplasm of prostate: Secondary | ICD-10-CM | POA: Diagnosis not present

## 2023-04-03 DIAGNOSIS — F411 Generalized anxiety disorder: Secondary | ICD-10-CM | POA: Diagnosis not present

## 2023-04-03 DIAGNOSIS — Z85820 Personal history of malignant melanoma of skin: Secondary | ICD-10-CM | POA: Diagnosis not present

## 2023-04-03 DIAGNOSIS — R399 Unspecified symptoms and signs involving the genitourinary system: Secondary | ICD-10-CM | POA: Diagnosis not present

## 2023-04-03 DIAGNOSIS — Z23 Encounter for immunization: Secondary | ICD-10-CM | POA: Diagnosis not present

## 2023-04-03 DIAGNOSIS — G3184 Mild cognitive impairment, so stated: Secondary | ICD-10-CM | POA: Diagnosis not present

## 2023-04-03 DIAGNOSIS — E782 Mixed hyperlipidemia: Secondary | ICD-10-CM | POA: Diagnosis not present

## 2023-04-03 DIAGNOSIS — Z0000 Encounter for general adult medical examination without abnormal findings: Secondary | ICD-10-CM | POA: Diagnosis not present

## 2023-04-14 DIAGNOSIS — N3941 Urge incontinence: Secondary | ICD-10-CM | POA: Diagnosis not present

## 2023-04-14 DIAGNOSIS — N401 Enlarged prostate with lower urinary tract symptoms: Secondary | ICD-10-CM | POA: Diagnosis not present

## 2023-06-11 DIAGNOSIS — L2089 Other atopic dermatitis: Secondary | ICD-10-CM | POA: Diagnosis not present

## 2023-10-02 DIAGNOSIS — I10 Essential (primary) hypertension: Secondary | ICD-10-CM | POA: Diagnosis not present

## 2023-10-02 DIAGNOSIS — Z23 Encounter for immunization: Secondary | ICD-10-CM | POA: Diagnosis not present

## 2023-10-02 DIAGNOSIS — E782 Mixed hyperlipidemia: Secondary | ICD-10-CM | POA: Diagnosis not present

## 2023-10-02 DIAGNOSIS — R399 Unspecified symptoms and signs involving the genitourinary system: Secondary | ICD-10-CM | POA: Diagnosis not present

## 2023-12-25 DIAGNOSIS — G3184 Mild cognitive impairment, so stated: Secondary | ICD-10-CM | POA: Diagnosis not present

## 2023-12-25 DIAGNOSIS — L739 Follicular disorder, unspecified: Secondary | ICD-10-CM | POA: Diagnosis not present

## 2023-12-25 DIAGNOSIS — T148XXA Other injury of unspecified body region, initial encounter: Secondary | ICD-10-CM | POA: Diagnosis not present

## 2024-04-05 DIAGNOSIS — E782 Mixed hyperlipidemia: Secondary | ICD-10-CM | POA: Diagnosis not present

## 2024-04-05 DIAGNOSIS — G3184 Mild cognitive impairment, so stated: Secondary | ICD-10-CM | POA: Diagnosis not present

## 2024-04-05 DIAGNOSIS — Z8546 Personal history of malignant neoplasm of prostate: Secondary | ICD-10-CM | POA: Diagnosis not present

## 2024-04-05 DIAGNOSIS — Z1331 Encounter for screening for depression: Secondary | ICD-10-CM | POA: Diagnosis not present

## 2024-04-05 DIAGNOSIS — R399 Unspecified symptoms and signs involving the genitourinary system: Secondary | ICD-10-CM | POA: Diagnosis not present

## 2024-04-05 DIAGNOSIS — F411 Generalized anxiety disorder: Secondary | ICD-10-CM | POA: Diagnosis not present

## 2024-04-05 DIAGNOSIS — Z85820 Personal history of malignant melanoma of skin: Secondary | ICD-10-CM | POA: Diagnosis not present

## 2024-04-05 DIAGNOSIS — Z0000 Encounter for general adult medical examination without abnormal findings: Secondary | ICD-10-CM | POA: Diagnosis not present

## 2024-04-05 DIAGNOSIS — E559 Vitamin D deficiency, unspecified: Secondary | ICD-10-CM | POA: Diagnosis not present

## 2024-04-05 DIAGNOSIS — I10 Essential (primary) hypertension: Secondary | ICD-10-CM | POA: Diagnosis not present

## 2024-05-10 DIAGNOSIS — N3941 Urge incontinence: Secondary | ICD-10-CM | POA: Diagnosis not present

## 2024-05-10 DIAGNOSIS — R399 Unspecified symptoms and signs involving the genitourinary system: Secondary | ICD-10-CM | POA: Diagnosis not present
